# Patient Record
Sex: Male | Born: 1985 | Race: Black or African American | Marital: Single | Smoking: Never smoker
Health system: Southern US, Community
[De-identification: ages and names within clinical notes are randomized; demographics above are authoritative.]

## PROBLEM LIST (undated history)

## (undated) DIAGNOSIS — I10 Essential (primary) hypertension: Secondary | ICD-10-CM

## (undated) DIAGNOSIS — Z973 Presence of spectacles and contact lenses: Secondary | ICD-10-CM

## (undated) HISTORY — DX: Presence of spectacles and contact lenses: Z97.3

---

## 2012-03-25 ENCOUNTER — Ambulatory Visit (INDEPENDENT_AMBULATORY_CARE_PROVIDER_SITE_OTHER): Payer: BC Managed Care – PPO | Admitting: Medical

## 2012-03-25 ENCOUNTER — Encounter: Payer: Self-pay | Admitting: Medical

## 2012-03-25 VITALS — BP 120/80 | HR 78 | Temp 98.1°F | Resp 16 | Ht 72.0 in | Wt 248.0 lb

## 2012-03-25 DIAGNOSIS — Z113 Encounter for screening for infections with a predominantly sexual mode of transmission: Secondary | ICD-10-CM

## 2012-03-25 DIAGNOSIS — Z0000 Encounter for general adult medical examination without abnormal findings: Secondary | ICD-10-CM

## 2012-03-25 DIAGNOSIS — Z23 Encounter for immunization: Secondary | ICD-10-CM

## 2012-03-25 LAB — COMPREHENSIVE METABOLIC PANEL
ALT: 20 U/L (ref 0–53)
CO2: 26 mEq/L (ref 19–32)
Calcium: 10.3 mg/dL (ref 8.4–10.5)
Chloride: 105 mEq/L (ref 96–112)
Creat: 0.89 mg/dL (ref 0.50–1.35)
Total Protein: 7.3 g/dL (ref 6.0–8.3)

## 2012-03-25 LAB — CBC WITH DIFFERENTIAL/PLATELET
Basophils Absolute: 0 10*3/uL (ref 0.0–0.1)
HCT: 45 % (ref 39.0–52.0)
Lymphocytes Relative: 49 % — ABNORMAL HIGH (ref 12–46)
Neutro Abs: 2.6 10*3/uL (ref 1.7–7.7)
Platelets: 425 10*3/uL — ABNORMAL HIGH (ref 150–400)
RDW: 12.6 % (ref 11.5–15.5)
WBC: 6.9 10*3/uL (ref 4.0–10.5)

## 2012-03-25 LAB — POCT URINALYSIS DIPSTICK
Bilirubin, UA: NEGATIVE
Ketones, UA: NEGATIVE
Leukocytes, UA: NEGATIVE
Spec Grav, UA: 1.02
pH, UA: 5

## 2012-03-25 LAB — LIPID PANEL
HDL: 36 mg/dL — ABNORMAL LOW (ref >39)
Triglycerides: 219 mg/dL — ABNORMAL HIGH (ref <150)

## 2012-03-25 NOTE — Progress Notes (Signed)
Subjective:   HPI  Eddie [name] is a 26 y.o. male who presents for a complete physical.   Preventative care: Last ophthalmology visit: within a year with Dr. Louanna Raw Last dental visit: within a year with Dr. Chilton Si  Prior vaccinations: TD or Tdap: not sure Influenza: usually declines.  Reviewed their medical, surgical, family, social, medication, and allergy history and updated chart as appropriate.   History reviewed. No pertinent past medical history.  History reviewed. No pertinent past surgical history.  Family History  Problem Relation Age of Onset  . Diabetes Father   . Stroke Maternal Uncle   . Heart disease Paternal Grandfather   . Diabetes Paternal Grandfather   . Cancer Neg Hx   . Hypertension Other     paternal side    History   Social History  . Marital Status: Single    Spouse Name: N/A    Number of Children: N/A  . Years of Education: N/A   Occupational History  . Not on file.   Social History Main Topics  . Smoking status: Never Smoker   . Smokeless tobacco: Not on file  . Alcohol Use: No     social, rare occasion  . Drug Use: No  . Sexually Active: Not on file   Other Topics Concern  . Not on file   Social History Narrative   Works [employer], Geophysicist/field seismologist, physically active on the job, single, no children    No current outpatient prescriptions on file prior to visit.    No Known Allergies   Review of Systems Constitutional: -fever, -chills, -sweats, -unexpected weight change, -decreased appetite, -fatigue Allergy: -sneezing, -itching, -congestion Dermatology: -changing moles, --rash, -lumps ENT: -runny nose, -ear pain, -sore throat, -hoarseness, -sinus pain, -teeth pain, - ringing in ears, -hearing loss, -nosebleeds Cardiology: -chest pain, -palpitations, -swelling, -difficulty breathing when lying flat, -waking up short of breath Respiratory: -cough, -shortness of breath, -difficulty breathing with exercise or exertion, -wheezing, -coughing up  blood Gastroenterology: -abdominal pain, -nausea, -vomiting, -diarrhea, -constipation, -blood in stool, -changes in bowel movement, -difficulty swallowing or eating Hematology: -bleeding, -bruising  Musculoskeletal: -joint aches, -muscle aches, -joint swelling, -back pain, -neck pain, -cramping, -changes in gait Ophthalmology: denies vision changes, eye redness, itching, discharge Urology: -burning with urination, -difficulty urinating, -blood in urine, -urinary frequency, -urgency, -incontinence Neurology: -headache, -weakness, -tingling, -numbness, -memory loss, -falls, -dizziness Psychology: -depressed mood, -agitation, -sleep problems     Objective:   Physical Exam  Filed Vitals:   03/25/12 1358  BP: 120/80  Pulse: 78  Temp: 98.1 F (36.7 C)  Resp: 16    General appearance: alert, no distress, WD/WN, AA male, muscular build Skin: few scattered macules, no worrisoe lesions HEENT: normocephalic, conjunctiva/corneas normal, sclerae anicteric, PERRLA, EOMi, nares patent, no discharge or erythema, pharynx normal Oral cavity: MMM, tongue normal, teeth normal Neck: supple, no lymphadenopathy, no thyromegaly, no masses, normal ROM, no bruits Chest: non tender, normal shape and expansion Heart: RRR, normal S1, S2, no murmurs Lungs: CTA bilaterally, no wheezes, rhonchi, or rales Abdomen: +bs, soft, non tender, non distended, no masses, no hepatomegaly, no splenomegaly, no bruits Back: non tender, normal ROM, no scoliosis Musculoskeletal: upper extremities non tender, no obvious deformity, normal ROM throughout, lower extremities non tender, no obvious deformity, normal ROM throughout Extremities: no edema, no cyanosis, no clubbing Pulses: 2+ symmetric, upper and lower extremities, normal cap refill Neurological: alert, oriented x 3, CN2-12 intact, strength normal upper extremities and lower extremities, sensation normal throughout, DTRs 2+ throughout, no  cerebellar signs, gait  normal Psychiatric: normal affect, behavior normal, pleasant  GU: normal male external genitalia, nontender, no masses, no hernia, no lymphadenopathy Rectal: deferred   Assessment and Plan :      Encounter Diagnoses  Name Primary?  . Routine general medical examination at a health care facility Yes  . Screen for STD (sexually transmitted disease)   . Need for prophylactic vaccination and inoculation against influenza   . Need for Tdap vaccination     Physical exam - discussed healthy lifestyle, diet, exercise, preventative care, vaccinations, and addressed their concerns.    STD screening today.  Discussed safe sex, condom use.  Flu vaccine, VIS and counseling given.  Tdap vaccine, VIS and counseling given   Follow-up pending labs, 1 year.

## 2012-03-26 ENCOUNTER — Encounter: Payer: Self-pay | Admitting: Medical

## 2012-03-26 NOTE — Patient Instructions (Signed)
Preventative Care for Adults, Male       REGULAR HEALTH EXAMS:  A routine yearly physical is a good way to check in with your primary care provider about your health and preventive screening. It is also an opportunity to share updates about your health and any concerns you have, and receive a thorough all-over exam.   Most health insurance companies pay for at least some preventative services.  Check with your health plan for specific coverages.  WHAT PREVENTATIVE SERVICES DO MEN NEED?  Adult men should have their weight and blood pressure checked regularly.   Men age 35 and older should have their cholesterol levels checked regularly.  Beginning at age 50 and continuing to age 75, men should be screened for colorectal cancer.  Certain people should may need continued testing until age 85.  Other cancer screening may include exams for testicular and prostate cancer.  Updating vaccinations is part of preventative care.  Vaccinations help protect against diseases such as the flu.  Lab tests are generally done as part of preventative care to screen for anemia and blood disorders, to screen for problems with the kidneys and liver, to screen for bladder problems, to check blood sugar, and to check your cholesterol level.  Preventative services generally include counseling about diet, exercise, avoiding tobacco, drugs, excessive alcohol consumption, and sexually transmitted infections.    GENERAL RECOMMENDATIONS FOR GOOD HEALTH:  Healthy diet:  Eat a variety of foods, including fruit, vegetables, animal or vegetable protein, such as meat, fish, chicken, and eggs, or beans, lentils, tofu, and grains, such as rice.  Drink plenty of water daily.  Decrease saturated fat in the diet, avoid lots of red meat, processed foods, sweets, fast foods, and fried foods.  Exercise:  Aerobic exercise helps maintain good heart health. At least 30-40 minutes of moderate-intensity exercise is recommended.  For example, a brisk walk that increases your heart rate and breathing. This should be done on most days of the week.   Find a type of exercise or a variety of exercises that you enjoy so that it becomes a part of your daily life.  Examples are running, walking, swimming, water aerobics, and biking.  For motivation and support, explore group exercise such as aerobic class, spin class, Zumba, Yoga,or  martial arts, etc.    Set exercise goals for yourself, such as a certain weight goal, walk or run in a race such as a 5k walk/run.  Speak to your primary care provider about exercise goals.  Disease prevention:  If you smoke or chew tobacco, find out from your caregiver how to quit. It can literally save your life, no matter how long you have been a tobacco user. If you do not use tobacco, never begin.   Maintain a healthy diet and normal weight. Increased weight leads to problems with blood pressure and diabetes.   The Body Mass Index or BMI is a way of measuring how much of your body is fat. Having a BMI above 27 increases the risk of heart disease, diabetes, hypertension, stroke and other problems related to obesity. Your caregiver can help determine your BMI and based on it develop an exercise and dietary program to help you achieve or maintain this important measurement at a healthful level.  High blood pressure causes heart and blood vessel problems.  Persistent high blood pressure should be treated with medicine if weight loss and exercise do not work.   Fat and cholesterol leaves deposits in your arteries   that can block them. This causes heart disease and vessel disease elsewhere in your body.  If your cholesterol is found to be high, or if you have heart disease or certain other medical conditions, then you may need to have your cholesterol monitored frequently and be treated with medication.   Ask if you should have a stress test if your history suggests this. A stress test is a test done on  a treadmill that looks for heart disease. This test can find disease prior to there being a problem.  Avoid drinking alcohol in excess (more than two drinks per day).  Avoid use of street drugs. Do not share needles with anyone. Ask for professional help if you need assistance or instructions on stopping the use of alcohol, cigarettes, and/or drugs.  Brush your teeth twice a day with fluoride toothpaste, and floss once a day. Good oral hygiene prevents tooth decay and gum disease. The problems can be painful, unattractive, and can cause other health problems. Visit your dentist for a routine oral and dental check up and preventive care every 6-12 months.   Look at your skin regularly.  Use a mirror to look at your back. Notify your caregivers of changes in moles, especially if there are changes in shapes, colors, a size larger than a pencil eraser, an irregular border, or development of new moles.  Safety:  Use seatbelts 100% of the time, whether driving or as a passenger.  Use safety devices such as hearing protection if you work in environments with loud noise or significant background noise.  Use safety glasses when doing any work that could send debris in to the eyes.  Use a helmet if you ride a bike or motorcycle.  Use appropriate safety gear for contact sports.  Talk to your caregiver about gun safety.  Use sunscreen with a SPF (or skin protection factor) of 15 or greater.  Lighter skinned people are at a greater risk of skin cancer. Don't forget to also wear sunglasses in order to protect your eyes from too much damaging sunlight. Damaging sunlight can accelerate cataract formation.   Practice safe sex. Use condoms. Condoms are used for birth control and to help reduce the spread of sexually transmitted infections (or STIs).  Some of the STIs are gonorrhea (the clap), chlamydia, syphilis, trichomonas, herpes, HPV (human papilloma virus) and HIV (human immunodeficiency virus) which causes AIDS.  The herpes, HIV and HPV are viral illnesses that have no cure. These can result in disability, cancer and death.   Keep carbon monoxide and smoke detectors in your home functioning at all times. Change the batteries every 6 months or use a model that plugs into the wall.   Vaccinations:  Stay up to date with your tetanus shots and other required immunizations. You should have a booster for tetanus every 10 years. Be sure to get your flu shot every year, since 5%-20% of the [place] population comes down with the flu. The flu vaccine changes each year, so being vaccinated once is not enough. Get your shot in the fall, before the flu season peaks.   Other vaccines to consider:  Pneumococcal vaccine to protect against certain types of pneumonia.  This is normally recommended for adults age 65 or older.  However, adults younger than 26 years old with certain underlying conditions such as diabetes, heart or lung disease should also receive the vaccine.  Shingles vaccine to protect against Varicella Zoster if you are older than age 60, or younger   than 26 years old with certain underlying illness.  Hepatitis A vaccine to protect against a form of infection of the liver by a virus acquired from food.  Hepatitis B vaccine to protect against a form of infection of the liver by a virus acquired from blood or body fluids, particularly if you work in health care.  If you plan to travel internationally, check with your local health department for specific vaccination recommendations.  Cancer Screening:  Most routine colon cancer screening begins at the age of 50. On a yearly basis, doctors may provide special easy to use take-home tests to check for hidden blood in the stool. Sigmoidoscopy or colonoscopy can detect the earliest forms of colon cancer and is life saving. These tests use a small camera at the end of a tube to directly examine the colon. Speak to your caregiver about this at age 50, when routine  screening begins (and is repeated every 5 years unless early forms of pre-cancerous polyps or small growths are found).   At the age of 50 men usually start screening for prostate cancer every year. Screening may begin at a younger age for those with higher risk. Those at higher risk include African-Americans or having a family history of prostate cancer. There are two types of tests for prostate cancer:   Prostate-specific antigen (PSA) testing. Recent studies raise questions about prostate cancer using PSA and you should discuss this with your caregiver.   Digital rectal exam (in which your doctor's lubricated and gloved finger feels for enlargement of the prostate through the anus).   Screening for testicular cancer.  Do a monthly exam of your testicles. Gently roll each testicle between your thumb and fingers, feeling for any abnormal lumps. The best time to do this is after a hot shower or bath when the tissues are looser. Notify your caregivers of any lumps, tenderness or changes in size or shape immediately.     

## 2012-03-28 ENCOUNTER — Ambulatory Visit (INDEPENDENT_AMBULATORY_CARE_PROVIDER_SITE_OTHER): Payer: BC Managed Care – PPO | Admitting: Medical

## 2012-03-28 ENCOUNTER — Encounter: Payer: Self-pay | Admitting: Medical

## 2012-03-28 VITALS — BP 120/80 | HR 92 | Temp 98.2°F | Resp 16 | Wt 250.0 lb

## 2012-03-28 DIAGNOSIS — L509 Urticaria, unspecified: Secondary | ICD-10-CM

## 2012-03-28 DIAGNOSIS — Z113 Encounter for screening for infections with a predominantly sexual mode of transmission: Secondary | ICD-10-CM

## 2012-03-28 DIAGNOSIS — E781 Pure hyperglyceridemia: Secondary | ICD-10-CM

## 2012-03-28 LAB — GC/CHLAMYDIA PROBE AMP, URINE

## 2012-03-28 MED ORDER — PREDNISONE 10 MG PO TABS: ORAL_TABLET | ORAL | Status: DC

## 2012-03-28 NOTE — Patient Instructions (Signed)

## 2012-03-28 NOTE — Progress Notes (Signed)
Subjective:   HPI  Eddie [name] is a 26 y.o. male who presents  Itchy rash, started yesterday on arms, but then spread to abdomen, legs, itching all over.  The only new changes was that flu vaccine and Tdap vaccine given here 3 days ago.   He has never had flu vaccine prior, but has had tdap in the past without c/o.   Other than recent [employer] drinks, no other recent exposures, no new foods, new chemicals, no new hygiene product.  No other triggers.  He notes similar but milder reactions in the past intermittent, cause unknown.  Has used a few benadryl oral. No other aggravating or relieving factors.  Otherwise feels fine.    No other c/o.  The following portions of the patient's history were reviewed and updated as appropriate: allergies, current medications, past family history, past medical history, past social history, past surgical history and problem list.  Past Medical History  Diagnosis Date  . Wears glasses     No Known Allergies   Review of Systems ROS reviewed and was negative other than noted in HPI or above.    Objective:   Physical Exam  General appearance: alert, no distress, WD/WN Skin: scattered small whealed lesions on arms and torso mostly, but a few on legs, neck Oral cavity: MMM, no lesions Neck: supple, no lymphadenopathy, no thyromegaly, no masses Heart: RRR, normal S1, S2, no murmurs Lungs: CTA bilaterally, no wheezes, rhonchi, or rales Abdomen: +bs, soft, non tender, non distended, no masses, no hepatomegaly, no splenomegaly  Assessment and Plan :     Encounter Diagnoses  Name Primary?  Marland Kitchen Urticaria Yes  . Screen for STD (sexually transmitted disease)   . High triglycerides    Urticaria - reviewed his recent labs from his physical.  C/t Benadryl every 4-6 hours, if not improving or worse, then begin Prednisone taper.  Script written.     Screen for STD - repeat Urine for GC/Chlamydia screen.  Somehow urine got lost at the lab, thus repeat urine  sample today.    High triglycerides - advised diet changes to improve triglycerides and low HDL.  He is very active.  Repeat 1 year.

## 2012-03-29 LAB — GC/CHLAMYDIA PROBE AMP, URINE: Chlamydia, Swab/Urine, PCR: NEGATIVE

## 2012-09-22 ENCOUNTER — Encounter: Payer: Self-pay | Admitting: Family Medicine

## 2012-09-22 ENCOUNTER — Ambulatory Visit (INDEPENDENT_AMBULATORY_CARE_PROVIDER_SITE_OTHER): Payer: BC Managed Care – PPO | Admitting: Family Medicine

## 2012-09-22 VITALS — BP 120/80 | HR 70 | Wt 250.0 lb

## 2012-09-22 DIAGNOSIS — S93402A Sprain of unspecified ligament of left ankle, initial encounter: Secondary | ICD-10-CM

## 2012-09-22 DIAGNOSIS — S93409A Sprain of unspecified ligament of unspecified ankle, initial encounter: Secondary | ICD-10-CM

## 2012-09-22 NOTE — Progress Notes (Signed)
  Subjective:    Patient ID: Eddie [name], male    DOB: 03-05-1986, 27 y.o.   MRN: [id number]  HPI He injured his left ankle on April 27 while playing flag football. He states he landed in a hole and inverted it. He  Was able to walk on it initially after the injury but later was unable and apparently missed 3 days of work because of this.   Review of Systems     Objective:   Physical Exam Exam of the left ankle show swelling and discoloration especially over the lateral malleolus. The ATF area was less swollen. Slight tenderness palpation over the lateral malleolus. No joint laxity was noted. X-ray shows no fracture.       Assessment & Plan:  Ankle sprain, left, initial encounter recommend Rice, ice, compression, elevation. Discussed slow return to activity based on symptoms. He'll return here if further difficulty.

## 2012-09-22 NOTE — Patient Instructions (Signed)
Ankle Sprain  An ankle sprain is an injury to the strong, fibrous tissues (ligaments) that hold the bones of your ankle joint together.   CAUSES  An ankle sprain is usually caused by a fall or by twisting your ankle. Ankle sprains most commonly occur when you step on the outer edge of your foot, and your ankle turns inward. People who participate in sports are more prone to these types of injuries.   SYMPTOMS    Pain in your ankle. The pain may be present at rest or only when you are trying to stand or walk.   Swelling.   Bruising. Bruising may develop immediately or within 1 to 2 days after your injury.   Difficulty standing or walking, particularly when turning corners or changing directions.  DIAGNOSIS   Your caregiver will ask you details about your injury and perform a physical exam of your ankle to determine if you have an ankle sprain. During the physical exam, your caregiver will press on and apply pressure to specific areas of your foot and ankle. Your caregiver will try to move your ankle in certain ways. An X-ray exam may be done to be sure a bone was not broken or a ligament did not separate from one of the bones in your ankle (avulsion fracture).   TREATMENT   Certain types of braces can help stabilize your ankle. Your caregiver can make a recommendation for this. Your caregiver may recommend the use of medicine for pain. If your sprain is severe, your caregiver may refer you to a surgeon who helps to restore function to parts of your skeletal system (orthopedist) or a physical therapist.  HOME CARE INSTRUCTIONS    Apply ice to your injury for 1 to 2 days or as directed by your caregiver. Applying ice helps to reduce inflammation and pain.   Put ice in a plastic bag.   Place a towel between your skin and the bag.   Leave the ice on for 15 to 20 minutes at a time, every 2 hours while you are awake.   Only take over-the-counter or prescription medicines for pain, discomfort, or fever as directed  by your caregiver.   Keep your injured leg elevated, when possible, to lessen swelling.   If your caregiver recommends crutches, use them as instructed. Gradually put weight on the affected ankle. Continue to use crutches or a cane until you can walk without feeling pain in your ankle.   If you have a plaster splint, wear the splint as directed by your caregiver. Do not rest it on anything harder than a pillow for the first 24 hours. Do not put weight on it. Do not get it wet. You may take it off to take a shower or bath.   You may have been given an elastic bandage to wear around your ankle to provide support. If the elastic bandage is too tight (you have numbness or tingling in your foot or your foot becomes cold and blue), adjust the bandage to make it comfortable.   If you have an air splint, you may blow more air into it or let air out to make it more comfortable. You may take your splint off at night and before taking a shower or bath.   Wiggle your toes in the splint several times per day to decrease swelling.  SEEK MEDICAL CARE IF:    You have an increase in bruising, swelling, or pain.   Your toes feel extremely cold   You have severe pain. MAKE SURE YOU:   Understand these instructions.  Will watch your condition.  Will get help right away if you are not doing well or get worse. Document Released: 05/11/2005 Document Revised: 08/03/2011 Document Reviewed: 05/23/2011 [facility] Patient Information 2013 [place], [place]. Rest. Ice. Compression. Elevation.  Slowly increase your physical activities based on discomfort.first you have to walk without pain in jaw then has to be done.

## 2021-02-18 ENCOUNTER — Encounter (HOSPITAL_COMMUNITY): Payer: Self-pay

## 2021-02-18 ENCOUNTER — Emergency Department (HOSPITAL_COMMUNITY): Payer: BC Managed Care – PPO

## 2021-02-18 ENCOUNTER — Emergency Department (HOSPITAL_COMMUNITY)
Admission: EM | Admit: 2021-02-18 | Discharge: 2021-02-18 | Disposition: A | Discharge status: Home / Self Care | Payer: BC Managed Care – PPO | Attending: Emergency Medicine | Admitting: Emergency Medicine

## 2021-02-18 DIAGNOSIS — I10 Essential (primary) hypertension: Secondary | ICD-10-CM

## 2021-02-18 DIAGNOSIS — M5412 Radiculopathy, cervical region: Secondary | ICD-10-CM

## 2021-02-18 DIAGNOSIS — E876 Hypokalemia: Secondary | ICD-10-CM

## 2021-02-18 DIAGNOSIS — R200 Anesthesia of skin: Secondary | ICD-10-CM | POA: Diagnosis not present

## 2021-02-18 DIAGNOSIS — M542 Cervicalgia: Secondary | ICD-10-CM | POA: Diagnosis present

## 2021-02-18 LAB — CBC
HCT: 47.3 % (ref 39.0–52.0)
Hemoglobin: 16.3 g/dL (ref 13.0–17.0)
MCH: 28.2 pg (ref 26.0–34.0)
MCHC: 34.5 g/dL (ref 30.0–36.0)
MCV: 81.7 fL (ref 80.0–100.0)
Platelets: 497 10*3/uL — ABNORMAL HIGH (ref 150–400)
RBC: 5.79 MIL/uL (ref 4.22–5.81)
RDW: 12.1 % (ref 11.5–15.5)
WBC: 7.3 10*3/uL (ref 4.0–10.5)
nRBC: 0 % (ref 0.0–0.2)

## 2021-02-18 LAB — BASIC METABOLIC PANEL
Anion gap: 14 (ref 5–15)
BUN: 12 mg/dL (ref 6–20)
CO2: 27 mmol/L (ref 22–32)
Calcium: 9.2 mg/dL (ref 8.9–10.3)
Chloride: 98 mmol/L (ref 98–111)
Creatinine, Ser: 0.81 mg/dL (ref 0.61–1.24)
GFR, Estimated: >60 mL/min (ref >60)
Glucose, Bld: 116 mg/dL — ABNORMAL HIGH (ref 70–99)
Potassium: 3 mmol/L — ABNORMAL LOW (ref 3.5–5.1)
Sodium: 139 mmol/L (ref 135–145)

## 2021-02-18 LAB — TROPONIN I (HIGH SENSITIVITY)
Troponin I (High Sensitivity): 7 ng/L (ref <18)
Troponin I (High Sensitivity): 6 ng/L (ref <18)

## 2021-02-18 LAB — MAGNESIUM: Magnesium: 2 mg/dL (ref 1.7–2.4)

## 2021-02-18 MED ORDER — POTASSIUM CHLORIDE CRYS ER 20 MEQ PO TBCR
40.0000 meq | EXTENDED_RELEASE_TABLET | Freq: Once | ORAL | Status: AC
  Administered 2021-02-18: 40 meq via ORAL
  Filled 2021-02-18: qty 2

## 2021-02-18 MED ORDER — KETOROLAC TROMETHAMINE 15 MG/ML IJ SOLN
15.0000 mg | Freq: Once | INTRAMUSCULAR | Status: AC
  Administered 2021-02-18: 15 mg via INTRAVENOUS
  Filled 2021-02-18: qty 1

## 2021-02-18 NOTE — ED Provider Notes (Signed)
Emergency Medicine Provider Triage Evaluation Note  Eddie [name] , a 35 y.o. male  was evaluated in triage.  Pt complains of right neck/trapezius pain. Worse with movement. Does a lot of lifting at his job and is right handed. Seen at [facility] and had elevated BP, sent here for htn urgency. Denies cp, sob.  Review of Systems  Positive: Right neck/trapezius pain Negative: Chest pain, sob  Physical Exam  BP (!) 217/140 (BP Location: Left Arm)   Pulse 85   Temp 99.3 F (37.4 C) (Oral)   Resp 18   Ht 6' (1.829 m)   Wt 126.6 kg   BMI 37.84 kg/m  Gen:   Awake, no distress   Resp:  Normal effort  MSK:   Moves extremities without difficulty  Other:  Ttp to the right cervical paraspinous muscles and medial right traps which reproduces sxs, heart rrr  Medical Decision Making  Medically screening exam initiated at 1:58 PM.  Appropriate orders placed.  Eddie [name] was informed that the remainder of the evaluation will be completed by another provider, this initial triage assessment does not replace that evaluation, and the importance of remaining in the ED until their evaluation is complete.     [name] 02/18/21 1359    Glendora Score, MD 02/18/21 1755

## 2021-02-18 NOTE — ED Triage Notes (Addendum)
Pt reports neck pain radiating down right arm since Thursday. Patient went to [facility] today and has a BP of 198/134 with abnormal ECG per patient and paper work.  Pt given 324mg  Aspirin at [facility] per paper work  EDP made aware and will repeat EKG.    A/Ox4 Ambulatory in triage

## 2021-02-18 NOTE — ED Notes (Signed)
Called lab to request add on Mg per order

## 2021-02-18 NOTE — Discharge Instructions (Addendum)
Recommend to establish care with a primary care provider to discuss outpatient blood pressure control after BP recheck.Recommend NSAIDs for your neck pain

## 2021-02-18 NOTE — ED Notes (Signed)
Notified provider of pt's continued BP elevation

## 2021-02-18 NOTE — ED Provider Notes (Signed)
[facility]-EMERGENCY DEPT Provider Note   CSN: [id number] Arrival date & time: 02/18/21  1314     History Chief Complaint  Patient presents with   Hypertension   Neck Pain    Radiating to arm pain     Eddie [name] is a 35 y.o. male.  The history is provided by the patient.  Hypertension This is a new problem. The current episode started more than 2 days ago. The problem occurs constantly. The problem has not changed since onset.Pertinent negatives include no chest pain, no headaches and no shortness of breath. The symptoms are relieved by rest. He has tried ASA for the symptoms. The treatment provided no relief.  Neck Pain Pain location:  Generalized neck Quality:  Shooting Pain radiates to:  R arm and R hand Pain severity:  Mild Duration:  6 days Timing:  Intermittent Progression:  Waxing and waning Chronicity:  New Context: lifting a heavy object   Context: not fall   Relieved by:  Nothing Worsened by:  Position (Extension of the neck) Associated symptoms: numbness   Associated symptoms: no chest pain, no fever, no headaches, no paresis, no tingling, no visual change and no weakness    Patient is a 35 year old male who presents from urgent care with concern for uncontrolled hypertension.  The patient states that he presented to urgent care earlier today due to radicular neck/trapezius pain.  Pain has been ongoing since last Thursday.  He describes a sharp and shooting pain that radiates from his neck down his right arm with some associated numbness along his first, second and third digits that is intermittent.  He also endorses some trapezius pain.  He states he does a lots of heavy lifting at his job.  He is right-handed.  He currently denies any pain.  He denies any recent trauma to his neck.  He denies any chest pain, shortness of breath, nausea, diaphoresis, back pain.  He additionally denies any headaches or blurry vision.  Past Medical History:   Diagnosis Date   Wears glasses     There are no problems to display for this patient.   History reviewed. No pertinent surgical history.     Family History  Problem Relation Age of Onset   Diabetes Father    Stroke Maternal Uncle    Heart disease Paternal Grandfather    Diabetes Paternal Grandfather    Cancer Neg Hx    Hypertension Other        paternal side    Social History   Tobacco Use   Smoking status: Never  Substance Use Topics   Alcohol use: No    Comment: social, rare occasion   Drug use: No    Home Medications Prior to Admission medications   Not on File    Allergies    Patient has no known allergies.  Review of Systems   Review of Systems  Constitutional:  Negative for fever.  Respiratory:  Negative for shortness of breath.   Cardiovascular:  Negative for chest pain.  Musculoskeletal:  Positive for neck pain.  Neurological:  Positive for numbness. Negative for tingling, weakness and headaches.  All other systems reviewed and are negative.  Physical Exam Updated Vital Signs BP (!) 169/110 (BP Location: Right Arm)   Pulse 83   Temp 99.3 F (37.4 C) (Oral)   Resp 18   Ht 6' (1.829 m)   Wt 126.6 kg   SpO2 95%   BMI 37.84 kg/m   Physical  Exam Vitals and nursing note reviewed.  Constitutional:      Appearance: He is well-developed.  HENT:     Head: Normocephalic and atraumatic.  Eyes:     Conjunctiva/sclera: Conjunctivae normal.  Neck:     Comments: No midline TTP. Negative spurling's test. Cardiovascular:     Rate and Rhythm: Normal rate and regular rhythm.     Heart sounds: No murmur heard. Pulmonary:     Effort: Pulmonary effort is normal. No respiratory distress.     Breath sounds: Normal breath sounds.  Abdominal:     Palpations: Abdomen is soft.     Tenderness: There is no abdominal tenderness.  Musculoskeletal:        General: Normal range of motion.     Cervical back: Normal range of motion and neck supple. No  tenderness.  Skin:    General: Skin is warm and dry.  Neurological:     General: No focal deficit present.     Mental Status: He is alert. Mental status is at baseline.     Cranial Nerves: No cranial nerve deficit.     Sensory: No sensory deficit.     Motor: No weakness.     Coordination: Coordination normal.    ED Results / Procedures / Treatments   Labs (all labs ordered are listed, but only abnormal results are displayed) Labs Reviewed  BASIC METABOLIC PANEL - Abnormal; Notable for the following components:      Result Value   Potassium 3.0 (*)    Glucose, Bld 116 (*)    All other components within normal limits  CBC - Abnormal; Notable for the following components:   Platelets 497 (*)    All other components within normal limits  MAGNESIUM  TROPONIN I (HIGH SENSITIVITY)  TROPONIN I (HIGH SENSITIVITY)    EKG EKG Interpretation  Date/Time:  Tuesday February 18 2021 13:52:54 EDT Ventricular Rate:  105 PR Interval:  179 QRS Duration: 95 QT Interval:  361 QTC Calculation: 478 R Axis:   71 Text Interpretation: Sinus tachycardia Probable left atrial enlargement Left ventricular hypertrophy Borderline T abnormalities, inferior leads Borderline prolonged QT interval No STEMI Confirmed by Ernie Avena ([id number]) on 02/18/2021 3:39:25 PM  Radiology DG Chest 2 View  Result Date: 02/18/2021 CLINICAL DATA:  35 year old male with history of neck pain radiating into the right arm. EXAM: CHEST - 2 VIEW COMPARISON:  No priors. FINDINGS: Lung volumes are normal. No consolidative airspace disease. No pleural effusions. No pneumothorax. No pulmonary nodule or mass noted. Pulmonary vasculature and the cardiomediastinal silhouette are within normal limits. IMPRESSION: No radiographic evidence of acute cardiopulmonary disease. Electronically Signed   By: Trudie Reed M.D.   On: 02/18/2021 14:58    Procedures Procedures   Medications Ordered in ED Medications  potassium chloride SA  (KLOR-CON) CR tablet 40 mEq (40 mEq Oral Given 02/18/21 1559)  ketorolac (TORADOL) 15 MG/ML injection 15 mg (15 mg Intravenous Given 02/18/21 1717)    ED Course  I have reviewed the triage vital signs and the nursing notes.  Pertinent labs & imaging results that were available during my care of the patient were reviewed by me and considered in my medical decision making (see chart for details).    MDM Rules/Calculators/A&P                           35 year old male with no significant past medical history who presents with radicular neck pain symptoms  and was sent from urgent care due to hypertension.  The patient currently endorses asymptomatic hypertension.  On arrival, the patient was afebrile, hypertensive BP 218/124, subsequently improved to the 180s systolic on recheck, not tachycardic or tachypneic, saturating well on room air.  The patient currently denies any chest pain, blurry vision, headaches, shortness of breath, abdominal pain, diaphoresis, palpitations.  He endorses radicular neck pain symptoms that have been present since Thursday.  He does endorse lifting heavy objects as part of his job.  Some subjective numbness along the median nerve distribution but no numbness noted on exam with intact 5 out of 5 grip strength and upper extremity strength bilaterally.  Negative spurling's test.  Initial EKG revealed some flattening of T waves in the inferior leads, likely benign early repolarization in the anterior leads.  Repeat EKG revealed possible ST depression in leads V5 and V6, considered hypertensive changes as the patient currently remains without symptoms of ACS.  No ripping or tearing pain.  Pulses intact 2+ radial bilaterally.  Low concern for aortic dissection.  No chest pain or shortness of breath or hypoxia or tachycardia to suggest PE.  The patient is PERC negative.  Work-up initiated to include CBC, BMP, troponins x2.  Potassium hypokalemic to 3.0, magnesium normal.  Potassium  supplementation orally 40 mEq was administered.  A chest x-ray was without evidence of pneumothorax, widened mediastinum or pneumonia.  Initial troponin was normal at 7.  The patient currently does not follow with a primary care provider and is not on any outpatient medications.  He remained asymptomatic with a steady improvement in his blood pressure after lying in a dark room for 15 minutes. Overall workup is reassuring at this time. Encouraged the patient to follow-up outpatient and establish care with a PCP to discuss BP recheck and further long term antihypertensive measures as indicated.    Final Clinical Impression(s) / ED Diagnoses Final diagnoses:  Cervical radiculopathy  Asymptomatic hypertension  Hypokalemia    Rx / DC Orders ED Discharge Orders     None        Ernie Avena, MD 02/19/21 1127

## 2021-02-20 ENCOUNTER — Emergency Department (HOSPITAL_COMMUNITY)
Admission: EM | Admit: 2021-02-20 | Discharge: 2021-02-20 | Disposition: A | Discharge status: Home / Self Care | Payer: BC Managed Care – PPO | Attending: Emergency Medicine | Admitting: Emergency Medicine

## 2021-02-20 ENCOUNTER — Other Ambulatory Visit: Payer: Self-pay

## 2021-02-20 ENCOUNTER — Encounter (HOSPITAL_COMMUNITY): Payer: Self-pay

## 2021-02-20 DIAGNOSIS — I10 Essential (primary) hypertension: Secondary | ICD-10-CM | POA: Insufficient documentation

## 2021-02-20 DIAGNOSIS — Z09 Encounter for follow-up examination after completed treatment for conditions other than malignant neoplasm: Secondary | ICD-10-CM

## 2021-02-20 DIAGNOSIS — R030 Elevated blood-pressure reading, without diagnosis of hypertension: Secondary | ICD-10-CM | POA: Diagnosis present

## 2021-02-20 MED ORDER — AMLODIPINE BESYLATE 5 MG PO TABS: 5.0000 mg | ORAL_TABLET | Freq: Every day | ORAL | 0 refills | Status: DC

## 2021-02-20 MED ORDER — AMLODIPINE BESYLATE 5 MG PO TABS
5.0000 mg | ORAL_TABLET | Freq: Once | ORAL | Status: AC
  Administered 2021-02-20: 5 mg via ORAL
  Filled 2021-02-20: qty 1

## 2021-02-20 NOTE — ED Notes (Signed)
An After Visit Summary was printed and given to the patient. Discharge instructions given and no further questions at this time.  

## 2021-02-20 NOTE — ED Triage Notes (Addendum)
Patient states he is here for a follow up to his visit for neck pain and hypertension. BP in triage- 177/124. Patient  states he has a new PCP, but appointment is a month away. Patient states he needs a work note for [employer].

## 2021-02-20 NOTE — ED Provider Notes (Signed)
[name]-EMER[facility]PT Provider Note   CSN: [id number] Arrival date & time: 02/20/21  1439     History Chief Complaint  Patient presents with   Follow-up    Eddie [name] is a 35 y.o. male who presents to the ED today for follow-up.  Patient was seen in the ED 2 days ago for cervical radiculopathy.  It appears he initially went to urgent care for same however was noted to have elevated blood pressure and was advised to come to the ED for further eval.  No history of same however does not see a PCP yearly for physicals.  It appears he was initially hypertensive at 218/124 with recheck in the 180s systolic.  Patient had work-up including lab work, chest x-ray, EKG however denied any specific symptoms related to high blood pressure.  His work-up was overall reassuring and he was discharged home with advised to follow-up with PCP.  He states he has an appointment on 10/14.  He states he is mostly here for a work note today.  He was given a work note for 2 days however he still is complaining of pain in his trapezius and neck area and feels like he cannot do heavy lifting at work.  Patient again denies any headache, vision changes, chest pain, shortness of breath, heart palpitations, nausea, vomiting, urinary issues.   The history is provided by the patient and medical records.      Past Medical History:  Diagnosis Date   Wears glasses     There are no problems to display for this patient.   History reviewed. No pertinent surgical history.     Family History  Problem Relation Age of Onset   Diabetes Father    Stroke Maternal Uncle    Heart disease Paternal Grandfather    Diabetes Paternal Grandfather    Cancer Neg Hx    Hypertension Other        paternal side    Social History   Tobacco Use   Smoking status: Never   Smokeless tobacco: Never  Vaping Use   Vaping Use: Never used  Substance Use Topics   Alcohol use: No    Comment: social, rare  occasion   Drug use: No    Home Medications Prior to Admission medications   Medication Sig Start Date End Date Taking? Authorizing Provider  amLODipine (NORVASC) 5 MG tablet Take 1 tablet (5 mg total) by mouth daily. 02/20/21  Yes Hyman Hopes, Aujanae Mccullum, PA-C    Allergies    Patient has no known allergies.  Review of Systems   Review of Systems  Constitutional:  Negative for chills and fever.  Eyes:  Negative for visual disturbance.  Respiratory:  Negative for shortness of breath.   Cardiovascular:  Negative for chest pain.  Musculoskeletal:  Positive for arthralgias.  Neurological:  Negative for weakness, numbness and headaches.  All other systems reviewed and are negative.  Physical Exam Updated Vital Signs BP (!) 177/124 (BP Location: Left Arm)   Pulse 81   Temp 97.9 F (36.6 C) (Oral)   Resp 18   Ht 6' (1.829 m)   Wt 126.6 kg   SpO2 100%   BMI 37.84 kg/m   Physical Exam Vitals and nursing note reviewed.  Constitutional:      Appearance: He is not ill-appearing.  HENT:     Head: Normocephalic and atraumatic.  Eyes:     Conjunctiva/sclera: Conjunctivae normal.  Neck:     Comments: + TTP to  right trapezius Cardiovascular:     Rate and Rhythm: Normal rate and regular rhythm.  Pulmonary:     Effort: Pulmonary effort is normal.     Breath sounds: Normal breath sounds.  Skin:    General: Skin is warm and dry.     Coloration: Skin is not jaundiced.  Neurological:     Mental Status: He is alert.    ED Results / Procedures / Treatments   Labs (all labs ordered are listed, but only abnormal results are displayed) Labs Reviewed - No data to display  EKG None  Radiology No results found.  Procedures Procedures   Medications Ordered in ED Medications  amLODipine (NORVASC) tablet 5 mg (has no administration in time range)    ED Course  I have reviewed the triage vital signs and the nursing notes.  Pertinent labs & imaging results that were available  during my care of the patient were reviewed by me and considered in my medical decision making (see chart for details).    MDM Rules/Calculators/A&P                           35 year old male who presents to the ED today for follow-up.  Seen 2 days ago for right neck/trapezius pain however found to be hypertensive without history of same.  He is here for a work note today.  On arrival to the ED patient is again hypertensive at 194/126 with repeat 177/124.  He is not prescribed blood pressure medication during last ED visit and has an appointment on 10/14 to establish care with PCP.  He denies any symptoms related to high blood pressure at this time.  He continues to complain of some pain to his neck and states he is here for a work note because he does not feel like he can continue his work with same.  Denies any worsening symptoms.  Given patient is asymptomatic at this time and had lab work done including EKG, chest x-ray, CBC, BMP 2 days ago I do not feel he needs repeat testing at this time.  I will place him on a blood pressure medication today and patient is instructed to follow-up with PCP as scheduled to discuss blood pressure.  I will give him a limited activity work note at this time however I did have a lengthy discussion with patient regarding the fact that we cannot keep him out of work indefinitely and he will need to follow-up with PCP regarding this as well.  I have discussed this with attending physician Dr. Freida Busman who thinks this is reasonable as well.  Patient to be discharged home at this time with new Rx for amlodipine.  This note was prepared using Dragon voice recognition software and may include unintentional dictation errors due to the inherent limitations of voice recognition software.  Final Clinical Impression(s) / ED Diagnoses Final diagnoses:  Follow up  Primary hypertension    Rx / DC Orders ED Discharge Orders          Ordered    amLODipine (NORVASC) 5 MG tablet   Daily        02/20/21 1622             Discharge Instructions      Please pick up medication for high blood pressure and take as prescribed.  It is recommended that you check your blood pressure daily the blood pressure cuff and keep a log of this to take  to your primary care appointment on 10/14.  Please also follow-up with them regarding the pain in your neck.  You can take ibuprofen and Tylenol as needed for same.         Tanda Rockers, PA-C 02/20/21 1624    Lorre Nick, MD 02/23/21 [phone]

## 2021-02-20 NOTE — Discharge Instructions (Addendum)
Please pick up medication for high blood pressure and take as prescribed.  It is recommended that you check your blood pressure daily the blood pressure cuff and keep a log of this to take to your primary care appointment on 10/14.  Please also follow-up with them regarding the pain in your neck.  You can take ibuprofen and Tylenol as needed for same.

## 2021-03-03 ENCOUNTER — Encounter (HOSPITAL_COMMUNITY): Payer: Self-pay

## 2021-03-03 ENCOUNTER — Emergency Department (HOSPITAL_COMMUNITY)
Admission: EM | Admit: 2021-03-03 | Discharge: 2021-03-03 | Disposition: A | Discharge status: Home / Self Care | Payer: BC Managed Care – PPO | Attending: Emergency Medicine | Admitting: Emergency Medicine

## 2021-03-03 ENCOUNTER — Other Ambulatory Visit: Payer: Self-pay

## 2021-03-03 DIAGNOSIS — I10 Essential (primary) hypertension: Secondary | ICD-10-CM | POA: Insufficient documentation

## 2021-03-03 DIAGNOSIS — Z79899 Other long term (current) drug therapy: Secondary | ICD-10-CM | POA: Diagnosis not present

## 2021-03-03 DIAGNOSIS — M542 Cervicalgia: Secondary | ICD-10-CM | POA: Diagnosis present

## 2021-03-03 DIAGNOSIS — M5412 Radiculopathy, cervical region: Secondary | ICD-10-CM | POA: Insufficient documentation

## 2021-03-03 HISTORY — DX: Essential (primary) hypertension: I10

## 2021-03-03 MED ORDER — AMLODIPINE BESYLATE 10 MG PO TABS: 5.0000 mg | ORAL_TABLET | Freq: Every day | ORAL | 0 refills | Status: DC

## 2021-03-03 NOTE — ED Provider Notes (Signed)
[facility]-EMERGENCY DEPT Provider Note   CSN: [id number] Arrival date & time: 03/03/21  1723     History Chief Complaint  Patient presents with   Neck Pain    Eddie [name] is a 35 y.o. male.  Patient presents the emergency department with complaint of requesting work limitations now.  Patient states that he needs a note for work stating that his cervical radiculopathy limits him from his normal duties.  Patient reports that his cervical radiculopathy has been improving since being seen in the emergency department.  States that he still has intermittent pain and numbness.  No pain or numbness at this time.  Patient reports that he has been taking his blood pressure medication.  Denies any headache, chest pain, numbness, weakness, facial asymmetry.     Neck Pain Associated symptoms: numbness   Associated symptoms: no chest pain, no fever, no headaches and no weakness       Past Medical History:  Diagnosis Date   Hypertension    Wears glasses     There are no problems to display for this patient.   History reviewed. No pertinent surgical history.     Family History  Problem Relation Age of Onset   Diabetes Father    Stroke Maternal Uncle    Heart disease Paternal Grandfather    Diabetes Paternal Grandfather    Cancer Neg Hx    Hypertension Other        paternal side    Social History   Tobacco Use   Smoking status: Never   Smokeless tobacco: Never  Vaping Use   Vaping Use: Never used  Substance Use Topics   Alcohol use: No    Comment: social, rare occasion   Drug use: No    Home Medications Prior to Admission medications   Medication Sig Start Date End Date Taking? Authorizing Provider  amLODipine (NORVASC) 5 MG tablet Take 1 tablet (5 mg total) by mouth daily. 02/20/21   Tanda Rockers, PA-C    Allergies    Patient has no known allergies.  Review of Systems   Review of Systems  Constitutional:  Negative for chills and  fever.  Eyes:  Negative for visual disturbance.  Respiratory:  Negative for shortness of breath.   Cardiovascular:  Negative for chest pain.  Gastrointestinal:  Negative for abdominal pain, nausea and vomiting.  Genitourinary:  Negative for difficulty urinating.  Musculoskeletal:  Positive for neck pain. Negative for back pain.  Skin:  Negative for color change and rash.  Neurological:  Positive for numbness. Negative for dizziness, tremors, seizures, syncope, facial asymmetry, speech difficulty, weakness, light-headedness and headaches.  Psychiatric/Behavioral:  Negative for confusion.    Physical Exam Updated Vital Signs BP (!) 210/112 (BP Location: Left Arm)   Pulse 92   Resp 18   Ht 6' (1.829 m)   Wt 122.5 kg   SpO2 100%   BMI 36.62 kg/m   Physical Exam Vitals and nursing note reviewed.  Constitutional:      General: He is not in acute distress.    Appearance: He is not ill-appearing, toxic-appearing or diaphoretic.  HENT:     Head: Normocephalic.  Eyes:     General: No scleral icterus.       Right eye: No discharge.        Left eye: No discharge.  Cardiovascular:     Rate and Rhythm: Normal rate.  Pulmonary:     Effort: Pulmonary effort is normal.  Skin:  General: Skin is warm and dry.  Neurological:     General: No focal deficit present.     Mental Status: He is alert and oriented to person, place, and time.     GCS: GCS eye subscore is 4. GCS verbal subscore is 5. GCS motor subscore is 6.     Comments: No dysarthria or facial asymmetry.  Patient moves all limbs equally without difficulty.  Patient able to stand and ambulate without difficulty.  Psychiatric:        Behavior: Behavior is cooperative.    ED Results / Procedures / Treatments   Labs (all labs ordered are listed, but only abnormal results are displayed) Labs Reviewed - No data to display  EKG None  Radiology No results found.  Procedures Procedures   Medications Ordered in  ED Medications - No data to display  ED Course  I have reviewed the triage vital signs and the nursing notes.  Pertinent labs & imaging results that were available during my care of the patient were reviewed by me and considered in my medical decision making (see chart for details).    MDM Rules/Calculators/A&P                           Alert 35 year old male no acute distress, nontoxic appearing.  Presents emergency department requesting a note for work allowing him to perform light duty due to his cervical radiculopathy.  Patient was informed that provider is unable to write a note detailing light duties.  Patient will need to follow-up with primary care provider or spine specialist.    Patient was noted to be hypertensive in the emergency department.  Patient has history of hypertension.  Recently started on Norvasc 5 mg.  Patient reports has been taking this medication as prescribed.  Patient's hypertension is asymptomatic.  Will increase Norvasc to 10 mg.  Patient has appointment with primary care provider on 10/14.  Discussed results, findings, treatment and follow up. Patient advised of return precautions. Patient verbalized understanding and agreed with plan.   Final Clinical Impression(s) / ED Diagnoses Final diagnoses:  Hypertension, unspecified type  Cervical radiculopathy    Rx / DC Orders ED Discharge Orders          Ordered    amLODipine (NORVASC) 10 MG tablet  Daily        03/03/21 1901             Berneice Heinrich 03/04/21 0000    Wynetta Fines, MD 03/06/21 2157

## 2021-03-03 NOTE — Discharge Instructions (Addendum)
You came to the emergency department requesting a note limiting your duties at work.  Fortunately we cannot provide you this in the emergency department.  While in emergency department your blood pressure was found to be elevated.  Please make sure that you are taking your medications as prescribed.  Please keep your appointment with your primary care doctor on 10/14.  Due to your elevated blood pressure here again in the emergency department I have increased your Norvasc medication to 10 mg.  Please take this once daily.  Please follow-up with your neurosurgeon.  Get help right away if you: Develop a severe headache or confusion. Have unusual weakness or numbness. Feel faint. Have severe pain in your chest or abdomen. Vomit repeatedly. Have trouble breathing.

## 2021-03-03 NOTE — ED Triage Notes (Signed)
Patient states he is here for a work note and states he can not get in to see his doctor yet. Patient states he is still having neck pain.

## 2021-03-06 ENCOUNTER — Other Ambulatory Visit: Payer: Self-pay

## 2021-03-07 ENCOUNTER — Ambulatory Visit (INDEPENDENT_AMBULATORY_CARE_PROVIDER_SITE_OTHER): Payer: BC Managed Care – PPO | Admitting: Family Medicine

## 2021-03-07 ENCOUNTER — Encounter: Payer: Self-pay | Admitting: Family Medicine

## 2021-03-07 VITALS — BP 124/80 | HR 86 | Temp 97.6°F | Ht 72.75 in | Wt 270.8 lb

## 2021-03-07 DIAGNOSIS — Z1159 Encounter for screening for other viral diseases: Secondary | ICD-10-CM

## 2021-03-07 DIAGNOSIS — Z13220 Encounter for screening for lipoid disorders: Secondary | ICD-10-CM

## 2021-03-07 DIAGNOSIS — Z131 Encounter for screening for diabetes mellitus: Secondary | ICD-10-CM

## 2021-03-07 DIAGNOSIS — M5412 Radiculopathy, cervical region: Secondary | ICD-10-CM

## 2021-03-07 DIAGNOSIS — I10 Essential (primary) hypertension: Secondary | ICD-10-CM

## 2021-03-07 DIAGNOSIS — E669 Obesity, unspecified: Secondary | ICD-10-CM | POA: Insufficient documentation

## 2021-03-07 NOTE — Progress Notes (Signed)
[facility]ARE [facility] [address] [place] [address] Dept: [phone] Dept Fax: [phone]  New Patient Office Visit  Subjective:    Patient ID: Eddie [name], male    DOB: 1985/10/21, 35 y.o..   MRN: [id number]  Chief Complaint  Patient presents with   Establish Care    NP- establish care. C/o having some tingling in hands and  RT shoulder pain x 2-3 weeks. Has taken Ibuprofen with some relief.   Declines flu shot today.     History of Present Illness:  Patient is in today to establish care. Eddie [name] was born in [place]. He attended about a year at [employer] after high school. 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 He is single and has no children. He denies tobacco or drug use. He occasionally drinks alcohol socially. He occasionally plays basketball, but admits he is not exercising regularly.  Eddie [name] was seen ion [facility] eon 9/27 related to a cervical radiculopathy. He was found at that visit to have a blood pressure of 198/134. He was started on amlodipine 5 mg. He followed up in the ED 2 days later showing some mild improvement. He returned to the ED on 10/10. His blood pressure was 210/112. His amlodipine dose was increased to 10 mg daily. He denies any headache or chest pain. He has started measuring his blood pressures daily at home. In the last several days, his systolics are in the 1`40-150 range and his diastolics in the 90s.  Eddie [name] has been doing stretching exercises for his neck and has seen gradual improvement of the neck symptoms.  Past Medical History: Patient Active Problem List   Diagnosis Date Noted   Essential hypertension 03/07/2021   Cervical radiculopathy 03/07/2021   Obesity (BMI 35.0-39.9 without comorbidity) 03/07/2021   History reviewed. No pertinent surgical history.  Family History  Problem Relation Age of Onset   Diabetes Father        Leg amputation   Stroke Maternal Uncle    Heart  disease Paternal Grandfather    Diabetes Paternal Grandfather    Hypertension Other        paternal side   Cancer Neg Hx    Outpatient Medications Prior to Visit  Medication Sig Dispense Refill   amLODipine (NORVASC) 10 MG tablet Take 0.5 tablets (5 mg total) by mouth daily for 14 days. 7 tablet 0   No facility-administered medications prior to visit.   No Known Allergies    Objective:   Today's Vitals   03/07/21 1411  BP: 124/80  Pulse: 86  Temp: 97.6 F (36.4 C)  TempSrc: Temporal  SpO2: 97%  Weight: 270 lb 12.8 oz (122.8 kg)  Height: 6' 0.75" (1.848 m)   Body mass index is 35.97 kg/m.   General: Well developed, well nourished. No acute distress. Neck: FROM. NO crepitance noted. Psych: Alert and oriented. Normal mood and affect.  Health Maintenance Due  Topic Date Due   COVID-19 Vaccine (1) Never done   Hepatitis C Screening  Never done   INFLUENZA VACCINE  12/23/2020   Lab Results CMP Latest Ref Rng & Units 02/18/2021 03/25/2012  Glucose 70 - 99 mg/dL 607(P) 93  BUN 6 - 20 mg/dL 12 18  Creatinine 7.10 - 1.24 mg/dL 6.26 9.48  Sodium 546 - 145 mmol/L 139 141  Potassium 3.5 - 5.1 mmol/L 3.0(L) 4.2  Chloride 98 - 111 mmol/L 98 105  CO2 22 - 32 mmol/L 27  26  Calcium 8.9 - 10.3 mg/dL 9.2 17.6  Total Protein 6.0 - 8.3 g/dL - 7.3  Total Bilirubin 0.3 - 1.2 mg/dL - 0.8  Alkaline Phos 39 - 117 U/L - 67  AST 0 - 37 U/L - 18  ALT 0 - 53 U/L - 20    CBC Latest Ref Rng & Units 02/18/2021 03/25/2012  WBC 4.0 - 10.5 K/uL 7.3 6.9  Hemoglobin 13.0 - 17.0 g/dL 16.0 73.7  Hematocrit 10.6 - 52.0 % 47.3 45.0  Platelets 150 - 400 K/uL 497(H) 425(H)     Assessment & Plan:   1. Essential hypertension Reviewed 2 ED records and urgent care notes and labs. I had a lengthy discussion with Eddie [name] about hypertension. We reviewed non-pharmacologic approaches to lowering his pressure. The BP today is much improved form his previous values. I will plant o continue him on amlodipine  for now. I will reassess his BP in 6 weeks.  - Basic metabolic panel; Future  2. Cervical radiculopathy Stable. Improving. Continue current physical approaches to relieving symptoms. I cautioned him against excessive ibuprofen use.  3. Encounter for hepatitis C screening test for low risk patient  - HCV Ab w Reflex to Quant PCR; Future  4. Screening for lipid disorders  - Lipid panel; Future  5. Screening for diabetes mellitus (DM)  - Hemoglobin A1c; Future  Loyola Mast, MD

## 2021-03-07 NOTE — Patient Instructions (Signed)

## 2021-03-13 ENCOUNTER — Other Ambulatory Visit (INDEPENDENT_AMBULATORY_CARE_PROVIDER_SITE_OTHER): Payer: BC Managed Care – PPO

## 2021-03-13 ENCOUNTER — Other Ambulatory Visit: Payer: Self-pay

## 2021-03-13 DIAGNOSIS — Z13220 Encounter for screening for lipoid disorders: Secondary | ICD-10-CM | POA: Diagnosis not present

## 2021-03-13 DIAGNOSIS — I10 Essential (primary) hypertension: Secondary | ICD-10-CM

## 2021-03-13 DIAGNOSIS — Z1159 Encounter for screening for other viral diseases: Secondary | ICD-10-CM | POA: Diagnosis not present

## 2021-03-13 DIAGNOSIS — Z131 Encounter for screening for diabetes mellitus: Secondary | ICD-10-CM | POA: Diagnosis not present

## 2021-03-13 LAB — LIPID PANEL
Cholesterol: 163 mg/dL (ref 0–200)
HDL: 36.6 mg/dL — ABNORMAL LOW (ref >39.00)
LDL Cholesterol: 104 mg/dL — ABNORMAL HIGH (ref 0–99)
NonHDL: 125.99
Total CHOL/HDL Ratio: 4
Triglycerides: 108 mg/dL (ref 0.0–149.0)
VLDL: 21.6 mg/dL (ref 0.0–40.0)

## 2021-03-13 LAB — BASIC METABOLIC PANEL
BUN: 11 mg/dL (ref 6–23)
CO2: 33 mEq/L — ABNORMAL HIGH (ref 19–32)
Calcium: 9.8 mg/dL (ref 8.4–10.5)
Chloride: 100 mEq/L (ref 96–112)
Creatinine, Ser: 0.9 mg/dL (ref 0.40–1.50)
GFR: 110.58 mL/min (ref >60.00)
Glucose, Bld: 103 mg/dL — ABNORMAL HIGH (ref 70–99)
Potassium: 3.5 mEq/L (ref 3.5–5.1)
Sodium: 140 mEq/L (ref 135–145)

## 2021-03-13 LAB — HEMOGLOBIN A1C: Hgb A1c MFr Bld: 5.7 % (ref 4.6–6.5)

## 2021-03-14 LAB — HCV AB W REFLEX TO QUANT PCR: HCV Ab: <0.1 s/co ratio (ref 0.0–0.9)

## 2021-03-14 LAB — HCV INTERPRETATION

## 2021-04-23 ENCOUNTER — Ambulatory Visit: Payer: BC Managed Care – PPO | Admitting: Family Medicine

## 2021-05-21 ENCOUNTER — Ambulatory Visit: Payer: BC Managed Care – PPO | Admitting: Family Medicine

## 2021-06-18 ENCOUNTER — Other Ambulatory Visit: Payer: Self-pay

## 2021-06-18 ENCOUNTER — Encounter: Payer: Self-pay | Admitting: Family Medicine

## 2021-06-18 ENCOUNTER — Ambulatory Visit: Payer: BC Managed Care – PPO | Admitting: Family Medicine

## 2021-06-18 VITALS — BP 170/102 | HR 75 | Temp 97.0°F | Ht 72.75 in | Wt 275.6 lb

## 2021-06-18 DIAGNOSIS — I10 Essential (primary) hypertension: Secondary | ICD-10-CM | POA: Diagnosis not present

## 2021-06-18 MED ORDER — AMLODIPINE BESYLATE 10 MG PO TABS: 10.0000 mg | ORAL_TABLET | Freq: Every day | ORAL | 3 refills | Status: DC

## 2021-06-18 NOTE — Progress Notes (Signed)
[facility] [address] [place] [address] Dept: [phone] Dept Fax: [phone]  Chronic Care Office Visit  Subjective:    Patient ID: Eddie [name], male    DOB: [birth year], 36 y.o..   MRN: [id number]  Chief Complaint  Patient presents with   Follow-up    6 wee f/u  HTN/meds.     History of Present Illness:  Patient is in today for reassessment of chronic medical issues.  Eddie [name] was diagnosed with hypertension this Fall, having been seen at Urgent Care on 9/27 related to a cervical radiculopathy. He was found at that visit to have a blood pressure of 198/134. He was started on amlodipine 5 mg. He followed up in the ED 2 days later showing some mild improvement. He returned to the ED on 10/10. His blood pressure was 210/112. His amlodipine dose was increased to 10 mg daily. Since his last visit, he notes he ran out of his medication. He denies any current headache or chest pain.  Past Medical History: Patient Active Problem List   Diagnosis Date Noted   Essential hypertension 03/07/2021   Cervical radiculopathy 03/07/2021   Obesity (BMI 35.0-39.9 without comorbidity) 03/07/2021   No past surgical history on file.  Family History  Problem Relation Age of Onset   Diabetes Father        Leg amputation   Stroke Maternal Uncle    Heart disease Paternal Grandfather    Diabetes Paternal Grandfather    Hypertension Other        paternal side   Cancer Neg Hx    Outpatient Medications Prior to Visit  Medication Sig Dispense Refill   amLODipine (NORVASC) 10 MG tablet Take 0.5 tablets (5 mg total) by mouth daily for 14 days. (Patient not taking: Reported on 06/18/2021) 7 tablet 0   No facility-administered medications prior to visit.   No Known Allergies    Objective:   Today's Vitals   06/18/21 1428 06/18/21 1432  BP: (!) 166/100 (!) 170/102  Pulse: 75   Temp: (!) 97 F (36.1 C)   TempSrc: Temporal    SpO2: 97%   Weight: 275 lb 9.6 oz (125 kg)   Height: 6' 0.75" (1.848 m)    Body mass index is 36.61 kg/m.   General: Well developed, well nourished. No acute distress. Psych: Alert and oriented. Normal mood and affect.  Health Maintenance Due  Topic Date Due   COVID-19 Vaccine (1) Never done   INFLUENZA VACCINE  12/23/2020   Lab Results Last lipids Lab Results  Component Value Date   CHOL 163 03/13/2021   HDL 36.60 (L) 03/13/2021   LDLCALC 104 (H) 03/13/2021   TRIG 108.0 03/13/2021   CHOLHDL 4 03/13/2021   BMP Latest Ref Rng & Units 03/13/2021 02/18/2021 03/25/2012  Glucose 70 - 99 mg/dL 885(O) 277(A) 93  BUN 6 - 23 mg/dL 11 12 18   Creatinine 0.40 - 1.50 mg/dL 1.28 7.86  Sodium 135 - 145 mEq/L 140 139 141  Potassium 3.5 - 5.1 mEq/L 3.5 3.0(L) 4.2  Chloride 96 - 112 mEq/L 100 98 105  CO2 19 - 32 mEq/L 33(H) 27 26  Calcium 8.4 - 10.5 mg/dL 9.8 9.2 7.67     Assessment & Plan:   1. Essential hypertension I will restart Eddie [name] on his blood pressure medication. I asked him to return in 2 months for reassessment.  - amLODipine (NORVASC) 10 MG tablet; Take 1 tablet (10 mg  total) by mouth daily for 14 days.  Dispense: 90 tablet; Refill: 3  *After the patient had left, I reviewed his EKG form his ED visit in 01/2021. This showed left atrial enlargement, LVH, and some t-wave abnormalities. I recommend we repeat this at his next visit. He may need a follow-up with cardiology for an echocardiogram.  Loyola Mast, MD

## 2021-07-02 ENCOUNTER — Telehealth: Payer: Self-pay | Admitting: Family Medicine

## 2021-07-02 NOTE — Telephone Encounter (Signed)
Please review and advise. Thanks. Dm/cma  

## 2021-07-02 NOTE — Telephone Encounter (Signed)
Pt is wanting  a 5mg  tablet verses the amLODipine (NORVASC) 10 MG tablet one he has now. He says sometimes he does not cut it right down the middle.   Pharmacy  [id number] [address] [place] [place] [address] [place]  [id number], [place] [address]  Phone:  [phone]  Fax:  [phone]  DEA #:  [id number]

## 2021-07-03 NOTE — Telephone Encounter (Signed)
Patient notified VIA phone to be taking 10 mg daily. He will go to the pharmacy and pick up the new Rx..  no further questions.  Dm/cma

## 2021-08-18 ENCOUNTER — Ambulatory Visit: Payer: BC Managed Care – PPO | Admitting: Family Medicine

## 2021-08-20 ENCOUNTER — Ambulatory Visit: Payer: BC Managed Care – PPO | Admitting: Family Medicine

## 2021-08-27 ENCOUNTER — Ambulatory Visit: Payer: BC Managed Care – PPO | Admitting: Family Medicine

## 2021-09-18 ENCOUNTER — Ambulatory Visit: Payer: BC Managed Care – PPO | Admitting: Family Medicine

## 2021-10-02 ENCOUNTER — Ambulatory Visit: Payer: BC Managed Care – PPO | Admitting: Family Medicine

## 2021-10-17 ENCOUNTER — Ambulatory Visit: Payer: BC Managed Care – PPO | Admitting: Family Medicine

## 2021-10-22 ENCOUNTER — Ambulatory Visit: Payer: BC Managed Care – PPO | Admitting: Family Medicine

## 2021-10-24 ENCOUNTER — Ambulatory Visit: Payer: BC Managed Care – PPO | Admitting: Family Medicine

## 2021-10-24 ENCOUNTER — Encounter: Payer: Self-pay | Admitting: Family Medicine

## 2021-10-24 VITALS — BP 144/86 | HR 78 | Temp 98.5°F | Wt 271.0 lb

## 2021-10-24 DIAGNOSIS — I10 Essential (primary) hypertension: Secondary | ICD-10-CM

## 2021-10-24 DIAGNOSIS — B084 Enteroviral vesicular stomatitis with exanthem: Secondary | ICD-10-CM

## 2021-10-24 MED ORDER — AMLODIPINE BESYLATE-VALSARTAN 10-160 MG PO TABS: 1.0000 | ORAL_TABLET | Freq: Every day | ORAL | 3 refills | Status: DC

## 2021-10-24 NOTE — Patient Instructions (Signed)
Hand, Foot, and Mouth Disease, Adult Hand, foot, and mouth disease is a common viral illness. It happens mainly in children who are younger than 5 years, but adolescents and adults can also get it. The illness can spread easily from person to person (is contagious) and often causes: Sores in the mouth. A rash on the hands and feet. Usually, this condition is not serious. Most people get better within 1-2 weeks. What are the causes? This illness is usually caused by a group of viruses called enteroviruses. A person is most contagious during the first week of the illness. The infection spreads through direct contact with: Discharge from the nose or throat of an infected person. Stool (feces) of an infected person. Surfaces that have been contaminated. What are the signs or symptoms? Symptoms of this condition include: Small sores in the mouth. These may cause pain. A rash on the hands and feet and sometimes on the buttocks. The rash may also occur on the arms, legs, or other areas of the body. The rash may look like small red bumps or sores and may have blisters. Fever. Sore throat. Body aches or headaches. Decreased appetite. How is this diagnosed? This condition is usually diagnosed based on: A physical exam. Your health care provider will look at your rash and mouth sores. In some cases, a stool sample or a throat swab may be taken to check for the virus or for other infections. How is this treated? In most cases, no treatment is needed. People usually get better within 2 weeks. To help relieve pain or fever, your health care provider may recommend over-the-counter medicines such as ibuprofen or acetaminophen. To help relieve discomfort from mouth sores, your health care provider may recommend using: Solutions that are rinsed in the mouth. Pain-relieving gel that is applied to the sores (topical gel). Antacid medicine. Follow these instructions at home: Managing pain and  discomfort  Rinse your mouth with a mixture of salt and water 3-4 times a day or as needed. To make salt water, completely dissolve -1 tsp (3-6 g) of salt in 1 cup (237 mL) of warm water. This can help to reduce pain from the mouth sores. To relieve discomfort when you are eating: Try combinations of foods to see what you can tolerate. Aim for a balanced diet. Eat soft foods. These may be easier to swallow. Avoid foods and drinks that are salty, spicy, or acidic. Avoid alcohol. Try cold food and drinks, such as water, milk, milkshakes, frozen ice pops, slushies, and sherbets. Low-calorie sports drinks are a good choice for staying hydrated. Relieving pain, itching, and discomfort in rash areas Keep cool and out of the sun. Sweating and feeling hot can make itching worse. Cool baths can be soothing. Add baking soda or dry oatmeal to the water to reduce itching. Do not bathe in hot water. Put cold, wet cloths (cold compresses) on itchy areas, as told by your health care provider. Use calamine lotion as recommended by your health care provider. This is an over-the-counter lotion that helps to relieve itchiness. Make sure you do not scratch or pick at the rash. To help prevent scratching: Keep your fingernails clean and cut short. Wear soft gloves or mittens while sleeping if scratching is a problem. General instructions  Take or apply over-the-counter and prescription medicines only as told by your health care provider. Wash your hands often with soap and water for at least 20 seconds. If soap and water are not available, use an   alcohol-based hand sanitizer. Clean and disinfect surfaces and shared items that you frequently touch. Stay away from work, schools, or other group settings during the first few days of the illness, or until your fever is gone for at least 24 hours. Return to your normal activities as told by your health care provider. Ask your health care provider what activities are  safe for you. Keep all follow-up visits. This is important. Contact a health care provider if: Your symptoms get worse or do not improve within 2 weeks. You have pain that does not get better with medicine. You have trouble swallowing. You develop sores or blisters on your lips or outside of your mouth. You have a fever for more than 3 days. Get help right away if: You develop signs of severe dehydration, such as: Decreased urination. This means urinating only very small amounts or fewer than 3 times in a 24-hour period. Urine that is very dark. Dry mouth, tongue, or lips. Decreased tears or sunken eyes. Dry skin. Rapid breathing. Decreased activity or being very sleepy. Pale skin. Your fingertips take longer than 2 seconds to turn pink after a gentle squeeze. Weight loss. You have a severe headache. You have a stiff neck. You have changes in your behavior. You have chest pain or trouble breathing. These symptoms may represent a serious problem that is an emergency. Do not wait to see if the symptoms will go away. Get medical help right away. Call your local emergency services (911 in the [place] Do not drive yourself to the hospital. Summary Hand, foot, and mouth disease is a common viral illness. This disease can spread easily from person to person (is contagious). The illness often causes sores in the mouth, a rash on the hands and feet, a fever, and a sore throat. Typically, no treatment is needed for this condition. People usually get better within 2 weeks. Get help right away if you develop signs of severe dehydration. This information is not intended to replace advice given to you by your health care provider. Make sure you discuss any questions you have with your health care provider. Document Revised: 02/12/2020 Document Reviewed: 02/12/2020 Elsevier Patient Education  2023 Elsevier Inc.  

## 2021-10-24 NOTE — Progress Notes (Signed)
[facility] [address] [place] [address] Dept: [phone] Dept Fax: [phone]  Office Visit  Subjective:    Patient ID: Eddie [name], male    DOB: 1985-11-29, 36 y.o..   MRN: [id number]  Chief Complaint  Patient presents with   Follow-up    Follow up on HTN. He is fasting. Patient needs refill on amlodipine. Patient states that after eating a mango Wednesday and experienced hives on his face and hands.     History of Present Illness:  Patient is in today for reassessment of his high blood pressure. Eddie [name] was diagnosed with hypertension this Fall, having been seen at Urgent Care on 9/27 related to a cervical radiculopathy. He was found at that visit to have a blood pressure of 198/134. He was started on amlodipine 5 mg. He followed up in the ED 2 days later showing some mild improvement. He returned to the ED on 10/10. His blood pressure was 210/112. His amlodipine dose was increased to 10 mg daily. He ran out of his meds, so was not on treatment at the time of his last visit on 1/25. I started the amlodipine back and asked him to follow up in March. He states he has been using his medication and denies any current symptoms related to this.  Over the past few days, he does note that he has a rash on his hands with some tingling of the hands and feet. He relates the appearance of the rash to having eaten mangoes that same day. He thought his rash might be hives. However, he has not noted any itching. He denies any oral lesions. However, he notes his nephew was recently diagnosed with Hand, Foot, and Mouth disease.   Past Medical History: Patient Active Problem List   Diagnosis Date Noted   Essential hypertension 03/07/2021   Cervical radiculopathy 03/07/2021   Obesity (BMI 35.0-39.9 without comorbidity) 03/07/2021    No past surgical history on file.  Family History  Problem Relation Age of Onset   Diabetes  Father        Leg amputation   Stroke Maternal Uncle    Heart disease Paternal Grandfather    Diabetes Paternal Grandfather    Hypertension Other        paternal side   Cancer Neg Hx    Outpatient Medications Prior to Visit  Medication Sig Dispense Refill   amLODipine (NORVASC) 10 MG tablet Take 1 tablet (10 mg total) by mouth daily for 14 days. 90 tablet 3   No facility-administered medications prior to visit.   No Known Allergies    Objective:   Today's Vitals   10/24/21 1401  BP: (!) 144/86  Pulse: 78  Temp: 98.5 F (36.9 C)  TempSrc: Temporal  SpO2: 98%  Weight: 271 lb (122.9 kg)   Body mass index is 36 kg/m.   General: Well developed, well nourished. No acute distress. Mouth: No intraoral lesions noted. Skin: Warm and dry. There are diffuse small erythematous lesions on the palm of both hands and palmar   aspect of the fingers. Each lesion has a pale central area. Psych: Alert and oriented. Normal mood and affect.  Health Maintenance Due  Topic Date Due   COVID-19 Vaccine (1) Never done     Assessment & Plan:   1. Essential hypertension Blood pressure remains high. I recommend we switch him to Exforge to see if we can achieve better control.  - amLODipine-valsartan (EXFORGE) 10-160 MG  tablet; Take 1 tablet by mouth daily.  Dispense: 30 tablet; Refill: 3  2. 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 I reassured him that this is benign and should resolve in 1-2 weeks. This does not appear to be hives, so he should feel safe in continuing to eat mangoes if he would like.  Return in about 6 weeks (around 12/05/2021) for Reassessment.   Loyola Mast, MD

## 2021-12-05 ENCOUNTER — Ambulatory Visit: Payer: BC Managed Care – PPO | Admitting: Family Medicine

## 2021-12-19 ENCOUNTER — Ambulatory Visit: Payer: BC Managed Care – PPO | Admitting: Family Medicine

## 2022-01-02 ENCOUNTER — Ambulatory Visit: Payer: BC Managed Care – PPO | Admitting: Family Medicine

## 2022-01-16 ENCOUNTER — Ambulatory Visit: Payer: BC Managed Care – PPO | Admitting: Family Medicine

## 2022-01-22 ENCOUNTER — Other Ambulatory Visit: Payer: Self-pay | Admitting: Family Medicine

## 2022-01-22 DIAGNOSIS — I10 Essential (primary) hypertension: Secondary | ICD-10-CM

## 2022-01-30 ENCOUNTER — Ambulatory Visit: Payer: BC Managed Care – PPO | Admitting: Family Medicine

## 2022-02-13 ENCOUNTER — Ambulatory Visit: Payer: BC Managed Care – PPO | Admitting: Family Medicine

## 2022-02-19 ENCOUNTER — Ambulatory Visit: Payer: BC Managed Care – PPO | Admitting: Family Medicine

## 2022-03-06 ENCOUNTER — Ambulatory Visit: Payer: BC Managed Care – PPO | Admitting: Family Medicine

## 2022-03-27 ENCOUNTER — Ambulatory Visit: Payer: BC Managed Care – PPO | Admitting: Family Medicine

## 2022-04-15 ENCOUNTER — Ambulatory Visit: Payer: BC Managed Care – PPO | Admitting: Family Medicine

## 2022-04-22 ENCOUNTER — Ambulatory Visit: Payer: BC Managed Care – PPO | Admitting: Family Medicine

## 2022-05-06 ENCOUNTER — Ambulatory Visit: Payer: BC Managed Care – PPO | Admitting: Family Medicine

## 2022-05-22 ENCOUNTER — Ambulatory Visit: Payer: BC Managed Care – PPO | Admitting: Family Medicine

## 2022-05-27 ENCOUNTER — Ambulatory Visit: Payer: BC Managed Care – PPO | Admitting: Family Medicine

## 2022-05-29 ENCOUNTER — Ambulatory Visit: Payer: BC Managed Care – PPO | Admitting: Family Medicine

## 2022-06-17 ENCOUNTER — Ambulatory Visit: Payer: BC Managed Care – PPO | Admitting: Family Medicine

## 2022-06-23 ENCOUNTER — Encounter: Payer: Self-pay | Admitting: Family Medicine

## 2022-06-23 ENCOUNTER — Ambulatory Visit (INDEPENDENT_AMBULATORY_CARE_PROVIDER_SITE_OTHER): Payer: BC Managed Care – PPO | Admitting: Family Medicine

## 2022-06-23 VITALS — BP 194/124 | HR 84 | Temp 97.4°F | Ht 72.75 in | Wt 282.4 lb

## 2022-06-23 DIAGNOSIS — I10 Essential (primary) hypertension: Secondary | ICD-10-CM | POA: Diagnosis not present

## 2022-06-23 MED ORDER — AMLODIPINE BESYLATE-VALSARTAN 10-320 MG PO TABS: 1.0000 | ORAL_TABLET | Freq: Every day | ORAL | 3 refills | Status: DC

## 2022-06-23 NOTE — Progress Notes (Signed)
  [facility] [address] [place] [address] Dept: [phone] Dept Fax: [phone]  Chronic Care Office Visit  Subjective:    Patient ID: Eddie [name], male    DOB: 11-14-1985, 37 y.o..   MRN: [id number]  Chief Complaint  Patient presents with   Follow-up    FU wth HTN,  no concerns    History of Present Illness:  Patient is in today for reassessment of chronic medical issues.  Eddie [name] was diagnosed with hypertension in the Fall of 2022. He was initiated on amlodipine, which was later increased to 10 mg daily. He followed up soon after to establish care. We have gradually stepped up his therapy to be on amlodipine-valsartan (Exforge) 10-160 mg daily. His last visit was 7 months ago. He notes that he has been feeling fine and has not noted any issues. He is using a wrist-type BP cuff at home. He states that on this, he tends to have systolic Bps in the 132-440N. He has not been consistent in taking his BP medicine daily. However, he notes he has been taking this recently.  Past Medical History: Patient Active Problem List   Diagnosis Date Noted   Essential hypertension 03/07/2021   Cervical radiculopathy 03/07/2021   Obesity (BMI 35.0-39.9 without comorbidity) 03/07/2021   No past surgical history on file.  Family History  Problem Relation Age of Onset   Diabetes Father        Leg amputation   Stroke Maternal Uncle    Heart disease Paternal Grandfather    Diabetes Paternal Grandfather    Hypertension Other        paternal side   Cancer Neg Hx    Outpatient Medications Prior to Visit  Medication Sig Dispense Refill   amLODipine-valsartan (EXFORGE) 10-160 MG tablet TAKE 1 TABLET BY MOUTH EVERY DAY 90 tablet 1   No facility-administered medications prior to visit.   No Known Allergies    Objective:   Today's Vitals   06/23/22 1320  BP: (!) 194/124  Pulse: 84  Temp: (!) 97.4 F (36.3 C)  TempSrc:  Tympanic  SpO2: 97%  Weight: 282 lb 6.4 oz (128.1 kg)  Height: 6' 0.75" (1.848 m)   Body mass index is 37.51 kg/m.   General: Well developed, well nourished. No acute distress. Psych: Alert and oriented. Normal mood and affect.  Health Maintenance Due  Topic Date Due   DTaP/Tdap/Td (2 - Td or Tdap) 03/25/2022     Assessment & Plan:   Problem List Items Addressed This Visit       Cardiovascular and Mediastinum   Essential hypertension - Primary    Eddie [name] continues to have significantly high BP. I urged him to be consistent in taking his medication. I also asked him to see me more regularly, so we can monitor and adjust his therapy until we have his BP in better control. I will increase his Exforge to 10-320 mg. We provided a BP log sheet. I asked him to monitor this at home. I will see him back in 1 month to reassess and review his logs.      Relevant Medications   amLODipine-valsartan (EXFORGE) 10-320 MG tablet    Return in about 4 weeks (around 07/21/2022) for Reassessment.   Haydee Salter, MD

## 2022-06-23 NOTE — Patient Instructions (Signed)
Hypertension, Adult High blood pressure (hypertension) is when the force of blood pumping through the arteries is too strong. The arteries are the blood vessels that carry blood from the heart throughout the body. Hypertension forces the heart to work harder to pump blood and may cause arteries to become narrow or stiff. Untreated or uncontrolled hypertension can lead to a heart attack, heart failure, a stroke, kidney disease, and other problems. A blood pressure reading consists of a higher number over a lower number. Ideally, your blood pressure should be below 120/80. The first ("top") number is called the systolic pressure. It is a measure of the pressure in your arteries as your heart beats. The second ("bottom") number is called the diastolic pressure. It is a measure of the pressure in your arteries as the heart relaxes. What are the causes? The exact cause of this condition is not known. There are some conditions that result in high blood pressure. What increases the risk? Certain factors may make you more likely to develop high blood pressure. Some of these risk factors are under your control, including: Smoking. Not getting enough exercise or physical activity. Being overweight. Having too much fat, sugar, calories, or salt (sodium) in your diet. Drinking too much alcohol. Other risk factors include: Having a personal history of heart disease, diabetes, high cholesterol, or kidney disease. Stress. Having a family history of high blood pressure and high cholesterol. Having obstructive sleep apnea. Age. The risk increases with age. What are the signs or symptoms? High blood pressure may not cause symptoms. Very high blood pressure (hypertensive crisis) may cause: Headache. Fast or irregular heartbeats (palpitations). Shortness of breath. Nosebleed. Nausea and vomiting. Vision changes. Severe chest pain, dizziness, and seizures. How is this diagnosed? This condition is diagnosed by  measuring your blood pressure while you are seated, with your arm resting on a flat surface, your legs uncrossed, and your feet flat on the floor. The cuff of the blood pressure monitor will be placed directly against the skin of your upper arm at the level of your heart. Blood pressure should be measured at least twice using the same arm. Certain conditions can cause a difference in blood pressure between your right and left arms. If you have a high blood pressure reading during one visit or you have normal blood pressure with other risk factors, you may be asked to: Return on a different day to have your blood pressure checked again. Monitor your blood pressure at home for 1 week or longer. If you are diagnosed with hypertension, you may have other blood or imaging tests to help your health care provider understand your overall risk for other conditions. How is this treated? This condition is treated by making healthy lifestyle changes, such as eating healthy foods, exercising more, and reducing your alcohol intake. You may be referred for counseling on a healthy diet and physical activity. Your health care provider may prescribe medicine if lifestyle changes are not enough to get your blood pressure under control and if: Your systolic blood pressure is above 130. Your diastolic blood pressure is above 80. Your personal target blood pressure may vary depending on your medical conditions, your age, and other factors. Follow these instructions at home: Eating and drinking  Eat a diet that is high in fiber and potassium, and low in sodium, added sugar, and fat. An example of this eating plan is called the DASH diet. DASH stands for Dietary Approaches to Stop Hypertension. To eat this way: Eat   plenty of fresh fruits and vegetables. Try to fill one half of your plate at each meal with fruits and vegetables. Eat whole grains, such as whole-wheat pasta, brown rice, or whole-grain bread. Fill about one  fourth of your plate with whole grains. Eat or drink low-fat dairy products, such as skim milk or low-fat yogurt. Avoid fatty cuts of meat, processed or cured meats, and poultry with skin. Fill about one fourth of your plate with lean proteins, such as fish, chicken without skin, beans, eggs, or tofu. Avoid pre-made and processed foods. These tend to be higher in sodium, added sugar, and fat. Reduce your daily sodium intake. Many people with hypertension should eat less than 1,500 mg of sodium a day. Do not drink alcohol if: Your health care provider tells you not to drink. You are pregnant, may be pregnant, or are planning to become pregnant. If you drink alcohol: Limit how much you have to: 0-1 drink a day for women. 0-2 drinks a day for men. Know how much alcohol is in your drink. In the [place] one drink equals one 12 oz bottle of beer (355 mL), one 5 oz glass of wine (148 mL), or one 1 oz glass of hard liquor (44 mL). Lifestyle  Work with your health care provider to maintain a healthy body weight or to lose weight. Ask what an ideal weight is for you. Get at least 30 minutes of exercise that causes your heart to beat faster (aerobic exercise) most days of the week. Activities may include walking, swimming, or biking. Include exercise to strengthen your muscles (resistance exercise), such as Pilates or lifting weights, as part of your weekly exercise routine. Try to do these types of exercises for 30 minutes at least 3 days a week. Do not use any products that contain nicotine or tobacco. These products include cigarettes, chewing tobacco, and vaping devices, such as e-cigarettes. If you need help quitting, ask your health care provider. Monitor your blood pressure at home as told by your health care provider. Keep all follow-up visits. This is important. Medicines Take over-the-counter and prescription medicines only as told by your health care provider. Follow directions carefully. Blood  pressure medicines must be taken as prescribed. Do not skip doses of blood pressure medicine. Doing this puts you at risk for problems and can make the medicine less effective. Ask your health care provider about side effects or reactions to medicines that you should watch for. Contact a health care provider if you: Think you are having a reaction to a medicine you are taking. Have headaches that keep coming back (recurring). Feel dizzy. Have swelling in your ankles. Have trouble with your vision. Get help right away if you: Develop a severe headache or confusion. Have unusual weakness or numbness. Feel faint. Have severe pain in your chest or abdomen. Vomit repeatedly. Have trouble breathing. These symptoms may be an emergency. Get help right away. Call 911. Do not wait to see if the symptoms will go away. Do not drive yourself to the hospital. Summary Hypertension is when the force of blood pumping through your arteries is too strong. If this condition is not controlled, it may put you at risk for serious complications. Your personal target blood pressure may vary depending on your medical conditions, your age, and other factors. For most people, a normal blood pressure is less than 120/80. Hypertension is treated with lifestyle changes, medicines, or a combination of both. Lifestyle changes include losing weight, eating a healthy,   low-sodium diet, exercising more, and limiting alcohol. This information is not intended to replace advice given to you by your health care provider. Make sure you discuss any questions you have with your health care provider. Document Revised: 03/18/2021 Document Reviewed: 03/18/2021 Elsevier Patient Education  2023 [employer] Inc[employer]  

## 2022-06-23 NOTE — Assessment & Plan Note (Signed)
Eddie [name] continues to have significantly high BP. I urged him to be consistent in taking his medication. I also asked him to see me more regularly, so we can monitor and adjust his therapy until we have his BP in better control. I will increase his Exforge to 10-320 mg. We provided a BP log sheet. I asked him to monitor this at home. I will see him back in 1 month to reassess and review his logs.

## 2022-07-21 ENCOUNTER — Ambulatory Visit: Payer: BC Managed Care – PPO | Admitting: Family Medicine

## 2022-07-22 ENCOUNTER — Ambulatory Visit: Payer: BC Managed Care – PPO | Admitting: Family Medicine

## 2022-07-29 ENCOUNTER — Encounter: Payer: Self-pay | Admitting: Family Medicine

## 2022-07-29 ENCOUNTER — Ambulatory Visit: Payer: BC Managed Care – PPO | Admitting: Family Medicine

## 2022-07-29 VITALS — BP 146/94 | HR 80 | Temp 98.6°F | Ht 72.75 in | Wt 282.2 lb

## 2022-07-29 DIAGNOSIS — H538 Other visual disturbances: Secondary | ICD-10-CM

## 2022-07-29 DIAGNOSIS — I10 Essential (primary) hypertension: Secondary | ICD-10-CM

## 2022-07-29 MED ORDER — HYDROCHLOROTHIAZIDE 25 MG PO TABS: 25.0000 mg | ORAL_TABLET | Freq: Every day | ORAL | 3 refills | Status: DC

## 2022-07-29 NOTE — Assessment & Plan Note (Signed)
Blood pressure is improved from his last office visit. I recommend we add a diuretic to his regimen. He will continue Exforge 10-320 mg daily and add HCTZ 25 mg daily. He should continue to check his BP at home and I will reassess him in 1 month.

## 2022-07-29 NOTE — Progress Notes (Signed)
  [facility] [address] [place] [address] Dept: [phone] Dept Fax: [phone]  Chronic Care Office Visit  Subjective:    Patient ID: Eddie [name], male    DOB: [birth year], 37 y.o..   MRN: [id number]  Chief Complaint  Patient presents with   Medical Management of Chronic Issues    F/u HTN. Average BP 128/88- 157/94.     History of Present Illness:  Patient is in today for reassessment of chronic medical issues.  Mr. Kulich was diagnosed with hypertension in the Fall of 2022. He was initiated on amlodipine, which was later increased to 10 mg daily. He followed up soon after to establish care. We have gradually stepped up his therapy to be on amlodipine-valsartan (Exforge) 10-320 mg daily. He is checking his BP at home. His 7-day morning average is 143/93. He does report a day a week or so ago when he developed some blurring of his vision. This last for about 24 hours.  Past Medical History: Patient Active Problem List   Diagnosis Date Noted   Essential hypertension 03/07/2021   Cervical radiculopathy 03/07/2021   Obesity (BMI 35.0-39.9 without comorbidity) 03/07/2021   History reviewed. No pertinent surgical history. Family History  Problem Relation Age of Onset   Diabetes Father        Leg amputation   Stroke Maternal Uncle    Heart disease Paternal Grandfather    Diabetes Paternal Grandfather    Hypertension Other        paternal side   Cancer Neg Hx    Outpatient Medications Prior to Visit  Medication Sig Dispense Refill   amLODipine-valsartan (EXFORGE) 10-320 MG tablet Take 1 tablet by mouth daily. 90 tablet 3   No facility-administered medications prior to visit.   No Known Allergies Objective:   Today's Vitals   07/29/22 1341 07/29/22 1354  BP: (!) 147/94 (!) 146/94  Pulse: 80   Temp: 98.6 F (37 C)   TempSrc: Temporal   SpO2: 96%   Weight: 282 lb 3.2 oz (128 kg)   Height: 6' 0.75" (1.848  m)    Body mass index is 37.49 kg/m.   General: Well developed, well nourished. No acute distress. Psych: Alert and oriented. Normal mood and affect.  Health Maintenance Due  Topic Date Due   DTaP/Tdap/Td (2 - Td or Tdap) 03/25/2022     Assessment & Plan:   Problem List Items Addressed This Visit       Cardiovascular and Mediastinum   Essential hypertension - Primary    Blood pressure is improved from his last office visit. I recommend we add a diuretic to his regimen. He will continue Exforge 10-320 mg daily and add HCTZ 25 mg daily. He should continue to check his BP at home and I will reassess him in 1 month.      Relevant Medications   hydrochlorothiazide (HYDRODIURIL) 25 MG tablet   Other Relevant Orders   Basic metabolic panel   Other Visit Diagnoses     Blurry vision       Etiology unclear. I will check labs to screen for diabetes.   Relevant Orders   Basic metabolic panel   Hemoglobin A1c       Return in about 4 weeks (around 08/26/2022) for Reassessment.   Haydee Salter, MD

## 2022-07-30 LAB — BASIC METABOLIC PANEL
BUN: 10 mg/dL (ref 6–23)
CO2: 31 mEq/L (ref 19–32)
Calcium: 9.9 mg/dL (ref 8.4–10.5)
Chloride: 101 mEq/L (ref 96–112)
Creatinine, Ser: 0.88 mg/dL (ref 0.40–1.50)
GFR: 110.26 mL/min (ref >60.00)
Glucose, Bld: 91 mg/dL (ref 70–99)
Potassium: 3.8 mEq/L (ref 3.5–5.1)
Sodium: 141 mEq/L (ref 135–145)

## 2022-07-30 LAB — HEMOGLOBIN A1C: Hgb A1c MFr Bld: 6.1 % (ref 4.6–6.5)

## 2022-08-26 ENCOUNTER — Ambulatory Visit: Payer: BC Managed Care – PPO | Admitting: Family Medicine

## 2022-09-02 ENCOUNTER — Encounter: Payer: Self-pay | Admitting: Family Medicine

## 2022-09-02 ENCOUNTER — Ambulatory Visit: Payer: BC Managed Care – PPO | Admitting: Family Medicine

## 2022-09-02 VITALS — BP 138/82 | HR 94 | Temp 97.9°F | Wt 285.6 lb

## 2022-09-02 DIAGNOSIS — R7303 Prediabetes: Secondary | ICD-10-CM | POA: Insufficient documentation

## 2022-09-02 DIAGNOSIS — I10 Essential (primary) hypertension: Secondary | ICD-10-CM

## 2022-09-02 MED ORDER — CHLORTHALIDONE 25 MG PO TABS: 25.0000 mg | ORAL_TABLET | Freq: Every day | ORAL | 5 refills | Status: DC

## 2022-09-02 NOTE — Progress Notes (Signed)
[facility] [address] [place] [address] Dept: [phone] Dept Fax: [phone]  Chronic Care Office Visit  Subjective:    Patient ID: Eddie [name], male    DOB: [birth year], 37 y.o..   MRN: [id number]  Chief Complaint  Patient presents with   Medical Management of Chronic Issues    4 week f/u HTN.  Average BP at home 149/94 - 120/75.    History of Present Illness:  Patient is in today for reassessment of chronic medical issues.  Eddie [name] was diagnosed with hypertension in the Fall of 2022. He was initiated on amlodipine, which was later increased to 10 mg daily. We have gradually stepped up his therapy to be on amlodipine-valsartan (Exforge) 10-320 mg daily. At his last visit, we added HCTZ 25 mg daily. He is checking his BP at home. His 7-day morning average is 127/81.   Past Medical History: Patient Active Problem List   Diagnosis Date Noted   Essential hypertension 03/07/2021   Cervical radiculopathy 03/07/2021   Obesity (BMI 35.0-39.9 without comorbidity) 03/07/2021   History reviewed. No pertinent surgical history. Family History  Problem Relation Age of Onset   Diabetes Father        Leg amputation   Stroke Maternal Uncle    Heart disease Paternal Grandfather    Diabetes Paternal Grandfather    Hypertension Other        paternal side   Cancer Neg Hx    Outpatient Medications Prior to Visit  Medication Sig Dispense Refill   amLODipine-valsartan (EXFORGE) 10-320 MG tablet Take 1 tablet by mouth daily. 90 tablet 3   hydrochlorothiazide (HYDRODIURIL) 25 MG tablet Take 1 tablet (25 mg total) by mouth daily. 90 tablet 3   No facility-administered medications prior to visit.   No Known Allergies Objective:   Today's Vitals   09/02/22 1350  BP: 138/82  Pulse: 94  Temp: 97.9 F (36.6 C)  TempSrc: Temporal  SpO2: 96%  Weight: 285 lb 9.6 oz (129.5 kg)   Body mass index is 37.94 kg/m.    General: Well developed, well nourished. No acute distress. Psych: Alert and oriented. Normal mood and affect.  Health Maintenance Due  Topic Date Due   DTaP/Tdap/Td (2 - Td or Tdap) 03/25/2022   Lab Results    Latest Ref Rng & Units 07/29/2022    2:11 PM 03/13/2021    1:52 PM 02/18/2021    2:09 PM  CMP  Glucose 70 - 99 mg/dL 91  545  625   BUN 6 - 23 mg/dL 10  11  12    Creatinine 0.40 - 1.50 mg/dL 6.38  9.37  3.42   Sodium 135 - 145 mEq/L 141  140  139   Potassium 3.5 - 5.1 mEq/L 3.8  3.5  3.0   Chloride 96 - 112 mEq/L 101  100  98   CO2 19 - 32 mEq/L 31  33  27   Calcium 8.4 - 10.5 mg/dL 9.9  9.8  9.2    Last hemoglobin A1c Lab Results  Component Value Date   HGBA1C 6.1 07/29/2022      Assessment & Plan:   Problem List Items Addressed This Visit       Cardiovascular and Mediastinum   Essential hypertension - Primary    Blood pressure is further improved from his last office visit. He will continue Exforge 10-320 mg daily and switch from HCTZ to chlorthalidone 25 mg daily. He should continue  to check his BP at home and I will reassess him in 6 weeks.      Relevant Medications   chlorthalidone (HYGROTON) 25 MG tablet   Return in about 6 weeks (around 10/14/2022) for Reassessment.   Loyola Mast, MD

## 2022-09-02 NOTE — Assessment & Plan Note (Signed)
Blood pressure is further improved from his last office visit. He will continue Exforge 10-320 mg daily and switch from HCTZ to chlorthalidone 25 mg daily. He should continue to check his BP at home and I will reassess him in 6 weeks.

## 2022-10-14 ENCOUNTER — Ambulatory Visit: Payer: BC Managed Care – PPO | Admitting: Family Medicine

## 2022-10-23 ENCOUNTER — Encounter: Payer: Self-pay | Admitting: Family Medicine

## 2022-10-23 ENCOUNTER — Ambulatory Visit: Payer: BC Managed Care – PPO | Admitting: Family Medicine

## 2022-10-23 VITALS — BP 130/82 | HR 86 | Temp 98.2°F | Ht 72.75 in | Wt 282.8 lb

## 2022-10-23 DIAGNOSIS — I10 Essential (primary) hypertension: Secondary | ICD-10-CM | POA: Diagnosis not present

## 2022-10-23 DIAGNOSIS — E876 Hypokalemia: Secondary | ICD-10-CM

## 2022-10-23 DIAGNOSIS — Z23 Encounter for immunization: Secondary | ICD-10-CM | POA: Diagnosis not present

## 2022-10-23 NOTE — Progress Notes (Signed)
  [facility] [address] [place] [address] Dept: [phone] Dept Fax: [phone]  Chronic Care Office Visit  Subjective:    Patient ID: Eddie [name], male    DOB: [birth year], 37 y.o..   MRN: [id number]  Chief Complaint  Patient presents with   Medical Management of Chronic Issues    6 week f/u BP.  Average BP at 118/76- 139/86   History of Present Illness:  Patient is in today for reassessment of chronic medical issues.  Mr. Farrand was diagnosed with hypertension in the Fall of 2022. He was initiated on amlodipine, which was later increased to 10 mg daily. We have gradually stepped up his therapy to be on amlodipine-valsartan (Exforge) 10-320 mg daily and chlorthalidone 25 mg daily. he is tolerating this without complaint. His 7-day average at home is 127/75.  Past Medical History: Patient Active Problem List   Diagnosis Date Noted   Prediabetes 09/02/2022   Essential hypertension 03/07/2021   Cervical radiculopathy 03/07/2021   Obesity (BMI 35.0-39.9 without comorbidity) 03/07/2021   History reviewed. No pertinent surgical history. Family History  Problem Relation Age of Onset   Diabetes Father        Leg amputation   Stroke Maternal Uncle    Heart disease Paternal Grandfather    Diabetes Paternal Grandfather    Hypertension Other        paternal side   Cancer Neg Hx    Outpatient Medications Prior to Visit  Medication Sig Dispense Refill   amLODipine-valsartan (EXFORGE) 10-320 MG tablet Take 1 tablet by mouth daily. 90 tablet 3   chlorthalidone (HYGROTON) 25 MG tablet Take 1 tablet (25 mg total) by mouth daily. 30 tablet 5   No facility-administered medications prior to visit.   No Known Allergies Objective:   Today's Vitals   10/23/22 1323  BP: 130/82  Pulse: 86  Temp: 98.2 F (36.8 C)  TempSrc: Temporal  SpO2: 97%  Weight: 282 lb 12.8 oz (128.3 kg)  Height: 6' 0.75" (1.848 m)   Body mass  index is 37.57 kg/m.   General: Well developed, well nourished. No acute distress. Psych: Alert and oriented. Normal mood and affect.  Health Maintenance Due  Topic Date Due   DTaP/Tdap/Td (2 - Td or Tdap) 03/25/2022   EKG: Normal sinus rhythm (rate= 74). Non-specific T-wave flattening in V5-V6 and limb leads.    Assessment & Plan:   Problem List Items Addressed This Visit       Cardiovascular and Mediastinum   Essential hypertension - Primary    Blood pressure is adequate control. I had reviewed his prior EKG from 2022, which did show left ventricular strain. On repeat today, his EKG is very normal. Continue Exforge 10-320 mg daily and chlorthalidone 25 mg daily. He should continue to check his BP at home and I will reassess him in 3 months.      Relevant Orders   Basic metabolic panel   EKG 12-Lead (Completed)   Other Visit Diagnoses     Need for Td vaccine       Relevant Orders   Td vaccine greater than or equal to 7yo preservative free IM       Return in about 3 months (around 01/23/2023) for Reassessment.   Loyola Mast, MD

## 2022-10-23 NOTE — Assessment & Plan Note (Signed)
Blood pressure is adequate control. I had reviewed his prior EKG from 2022, which did show left ventricular strain. On repeat today, his EKG is very normal. Continue Exforge 10-320 mg daily and chlorthalidone 25 mg daily. He should continue to check his BP at home and I will reassess him in 3 months.

## 2022-10-24 LAB — BASIC METABOLIC PANEL
BUN/Creatinine Ratio: 18 (ref 9–20)
BUN: 17 mg/dL (ref 6–20)
CO2: 27 mmol/L (ref 20–29)
Calcium: 10.1 mg/dL (ref 8.7–10.2)
Chloride: 97 mmol/L (ref 96–106)
Creatinine, Ser: 0.93 mg/dL (ref 0.76–1.27)
Glucose: 102 mg/dL — ABNORMAL HIGH (ref 70–99)
Potassium: 3.2 mmol/L — ABNORMAL LOW (ref 3.5–5.2)
Sodium: 142 mmol/L (ref 134–144)
eGFR: 108 mL/min/{1.73_m2} (ref >59)

## 2022-10-26 MED ORDER — POTASSIUM CHLORIDE CRYS ER 20 MEQ PO TBCR: 20.0000 meq | EXTENDED_RELEASE_TABLET | Freq: Every day | ORAL | 3 refills | Status: DC

## 2022-10-26 NOTE — Addendum Note (Signed)
Addended by: Loyola Mast on: 10/26/2022 08:10 AM   Modules accepted: Orders

## 2022-12-23 ENCOUNTER — Encounter (INDEPENDENT_AMBULATORY_CARE_PROVIDER_SITE_OTHER): Payer: Self-pay

## 2023-01-05 IMAGING — CR DG CHEST 2V
2 series · 2 of 2 positions shown · non-contrast
Comparison: No priors.

CLINICAL DATA: 35-year-old male with history of neck pain radiating
into the right arm.

EXAM:
CHEST - 2 VIEW

[w chest pa]
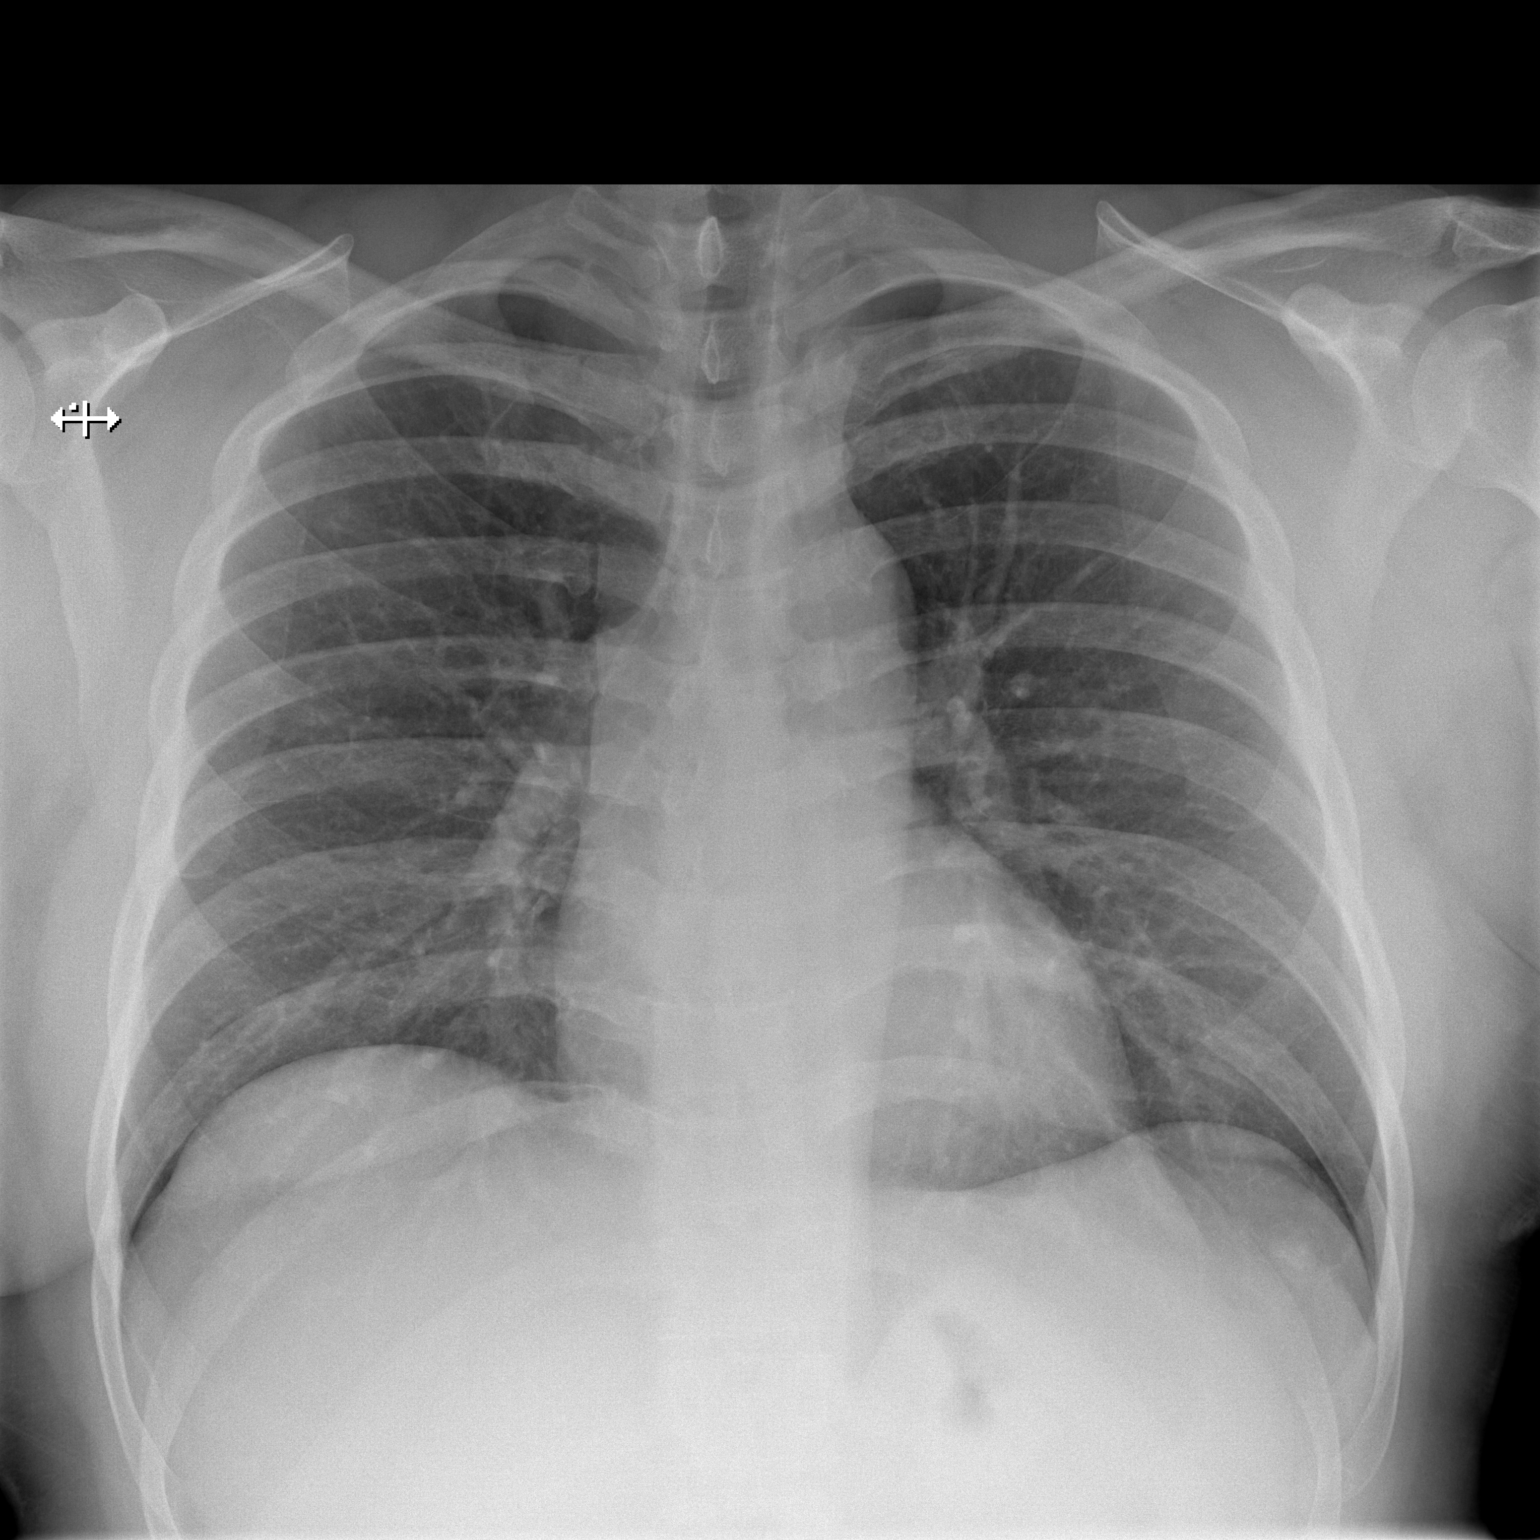

[w chest lat]
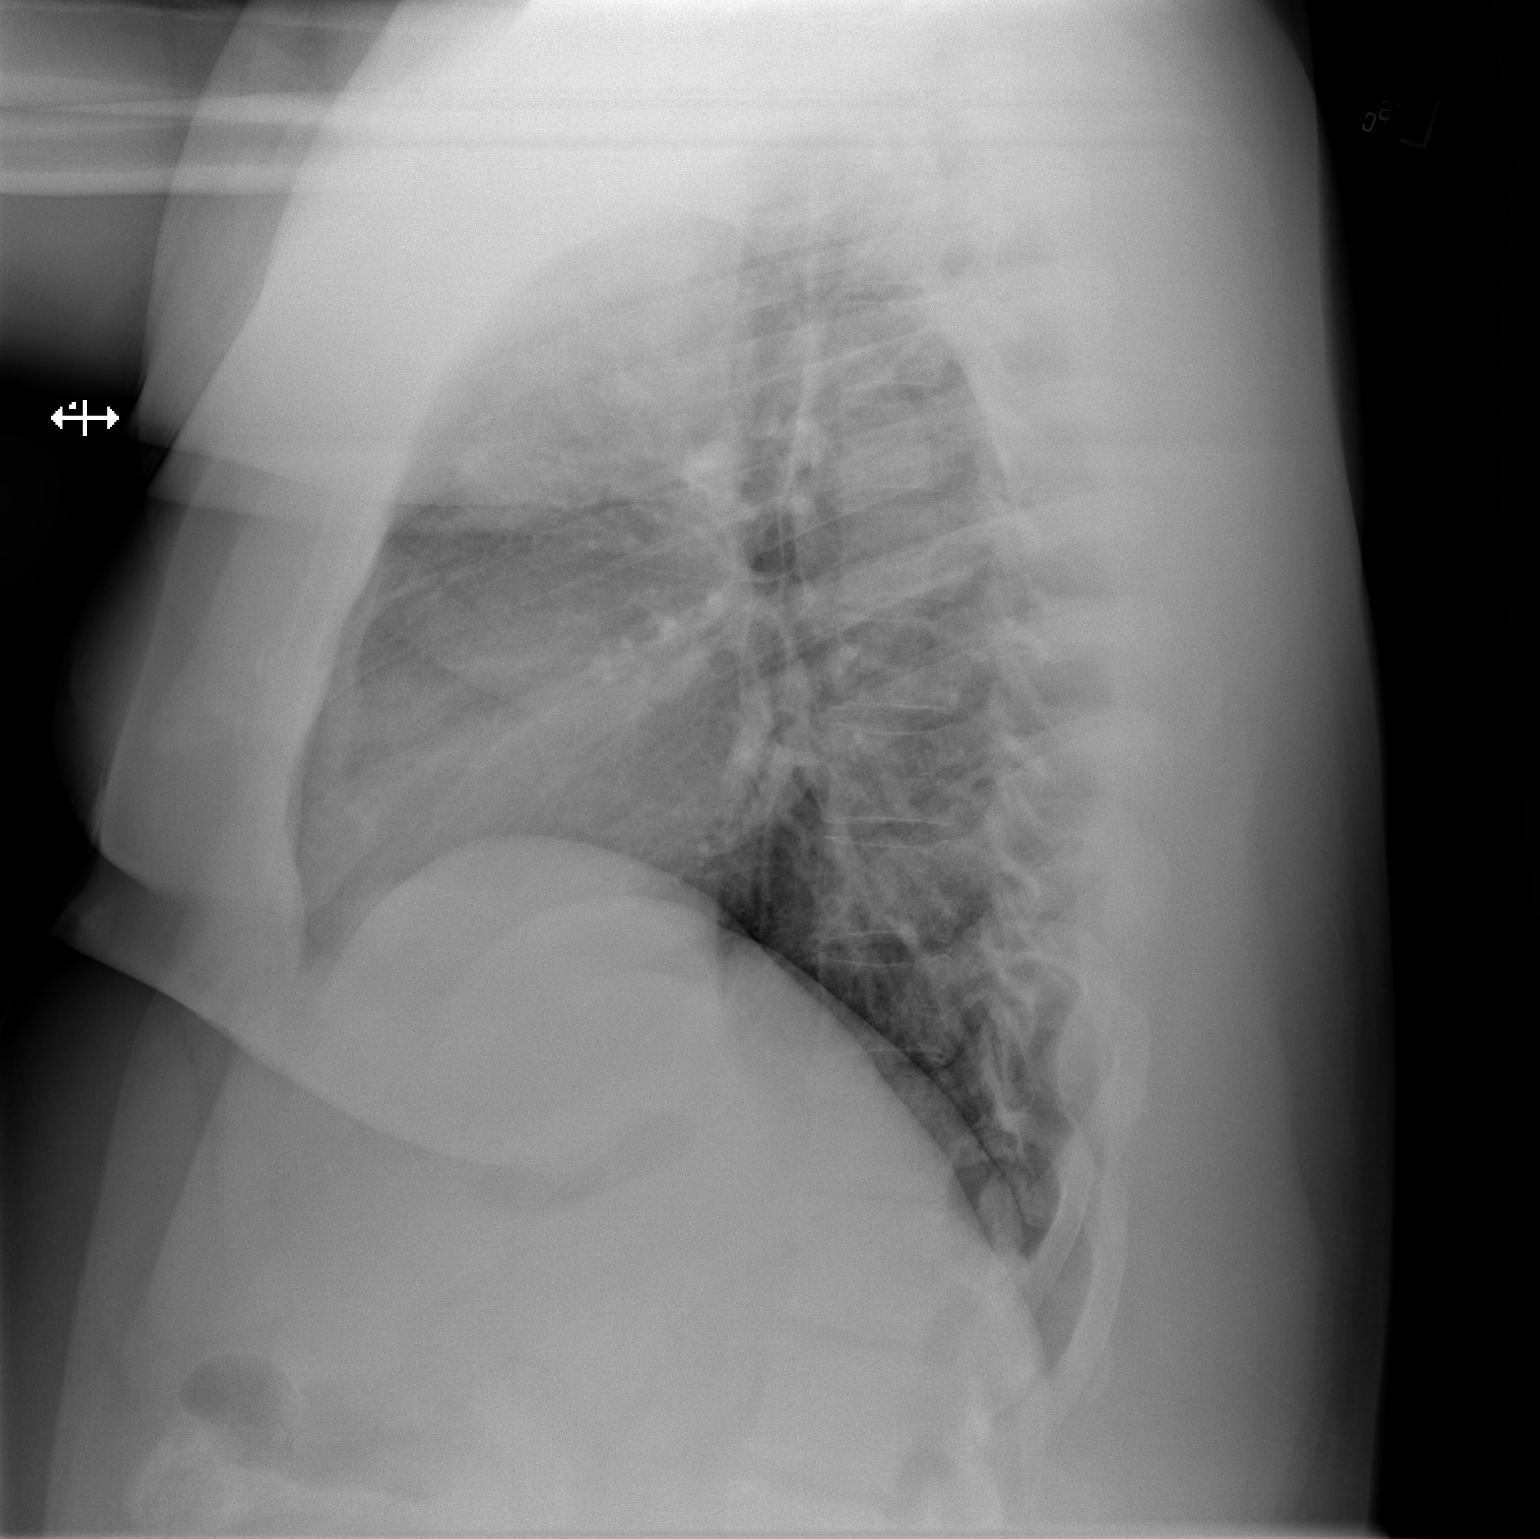

[2 of 2 positions shown; findings below may reference images not displayed]

FINDINGS: Lung volumes are normal. No consolidative airspace disease. No
pleural effusions. No pneumothorax. No pulmonary nodule or mass
noted. Pulmonary vasculature and the cardiomediastinal silhouette
are within normal limits.
IMPRESSION: No radiographic evidence of acute cardiopulmonary disease.

## 2023-01-22 ENCOUNTER — Other Ambulatory Visit: Payer: Self-pay | Admitting: Family Medicine

## 2023-01-22 ENCOUNTER — Ambulatory Visit: Payer: BC Managed Care – PPO | Admitting: Family Medicine

## 2023-01-22 DIAGNOSIS — E876 Hypokalemia: Secondary | ICD-10-CM

## 2023-01-29 ENCOUNTER — Ambulatory Visit: Payer: BC Managed Care – PPO | Admitting: Family Medicine

## 2023-01-29 ENCOUNTER — Encounter: Payer: Self-pay | Admitting: Family Medicine

## 2023-01-29 VITALS — BP 124/76 | HR 81 | Temp 97.0°F | Ht 72.75 in | Wt 284.8 lb

## 2023-01-29 DIAGNOSIS — I10 Essential (primary) hypertension: Secondary | ICD-10-CM

## 2023-01-29 NOTE — Progress Notes (Signed)
  [facility] [address] [place] [address] Dept: [phone] Dept Fax: [phone]  Chronic Care Office Visit  Subjective:    Patient ID: Eddie [name], male    DOB: [birth year], 37 y.o..   MRN: [id number]  Chief Complaint  Patient presents with   Hypertension    3 month f/u.  No concerns.    History of Present Illness:  Patient is in today for reassessment of chronic medical issues.  Eddie [name] was diagnosed with hypertension in the Fall of 2022. He is managed on amlodipine-valsartan (Exforge) 10-320 mg daily and chlorthalidone 25 mg daily.Hhe is tolerating this without complaint. His 7-day average at home is 129/814.   Past Medical History: Patient Active Problem List   Diagnosis Date Noted   Prediabetes 09/02/2022   Essential hypertension 03/07/2021   Cervical radiculopathy 03/07/2021   Obesity (BMI 35.0-39.9 without comorbidity) 03/07/2021   No past surgical history on file. Family History  Problem Relation Age of Onset   Diabetes Father        Leg amputation   Stroke Maternal Uncle    Heart disease Paternal Grandfather    Diabetes Paternal Grandfather    Hypertension Other        paternal side   Cancer Neg Hx    Outpatient Medications Prior to Visit  Medication Sig Dispense Refill   amLODipine-valsartan (EXFORGE) 10-320 MG tablet Take 1 tablet by mouth daily. 90 tablet 3   chlorthalidone (HYGROTON) 25 MG tablet Take 1 tablet (25 mg total) by mouth daily. 30 tablet 5   potassium chloride SA (KLOR-CON M20) 20 MEQ tablet TAKE 1 TABLET BY MOUTH EVERY DAY 90 tablet 3   No facility-administered medications prior to visit.   Allergies  Allergen Reactions   Influenza Vaccines Hives   Objective:   Today's Vitals   01/29/23 1350  BP: 124/76  Pulse: 81  Temp: (!) 97 F (36.1 C)  TempSrc: Temporal  SpO2: 94%  Weight: 284 lb 12.8 oz (129.2 kg)  Height: 6' 0.75" (1.848 m)   Body mass index is 37.83  kg/m.   General: Well developed, well nourished. No acute distress. Psych: Alert and oriented. Normal mood and affect.  There are no preventive care reminders to display for this patient.    Assessment & Plan:   Problem List Items Addressed This Visit       Cardiovascular and Mediastinum   Essential hypertension - Primary    Blood pressure is good control. Continue Exforge 10-320 mg daily and chlorthalidone 25 mg daily. He should continue to check his BP at home and I will reassess him in 4 months.       Return in about 4 months (around 05/31/2023) for Reassessment.   Loyola Mast, MD

## 2023-01-29 NOTE — Assessment & Plan Note (Signed)
Blood pressure is good control. Continue Exforge 10-320 mg daily and chlorthalidone 25 mg daily. He should continue to check his BP at home and I will reassess him in 4 months.

## 2023-02-27 ENCOUNTER — Other Ambulatory Visit: Payer: Self-pay | Admitting: Family Medicine

## 2023-02-27 DIAGNOSIS — I10 Essential (primary) hypertension: Secondary | ICD-10-CM

## 2023-04-19 ENCOUNTER — Other Ambulatory Visit: Payer: Self-pay | Admitting: Family Medicine

## 2023-04-19 DIAGNOSIS — I10 Essential (primary) hypertension: Secondary | ICD-10-CM

## 2023-06-04 ENCOUNTER — Ambulatory Visit: Payer: BC Managed Care – PPO | Admitting: Family Medicine

## 2023-06-07 ENCOUNTER — Ambulatory Visit: Payer: BC Managed Care – PPO | Admitting: Family Medicine

## 2023-06-07 ENCOUNTER — Ambulatory Visit: Payer: Self-pay | Admitting: Family Medicine

## 2023-06-07 NOTE — Telephone Encounter (Signed)
 Copied from CRM [id number]. Topic: Clinical - Red Word Triage >> Jun 07, 2023  2:58 PM Eddie [name] wrote: Red Word that prompted transfer to Nurse Triage: patient slipped yesterday and fell on his shoulder and now is in pain and can not lift his arm up   Chief Complaint: Shoulder Pain  Symptoms: Shoulder Pain, Weakness, Pain with ROM Frequency: Acute Pertinent Negatives: Patient denies numbness, tingling, or paralysis.  Disposition: [] ED /[] Urgent Care (no appt availability in office) / [x] Appointment(In office/virtual)/ []  [facility]/ [] Home Care/ [] Refused Recommended Disposition /[] [place] [facility]/ []  Follow-up with PCP Additional Notes: Eddie [name] is a 38 year old male being triaged today for a recent shoulder injury after a fall last night. The patient reports not being able to lift the arm without using the other arm to do so.  Cannot lift the right arm above the head without pain or limitation. Denies having taken any pain medication for relief of symptoms. The patient Is without any neurological deficit. Appointment made in office for today.    Reason for Disposition  Can'[name] move injured shoulder normally (e.g., full range of motion, able to touch top of head)  Answer Assessment - Initial Assessment Questions 1. ONSET: When did the pain start?     Yesterday  2. LOCATION: Where is the pain located?     Right Shoulder  3. PAIN: How bad is the pain? (Scale 1-10; or mild, moderate, severe)   - MILD (1-3): doesn'[name] interfere with normal activities   - MODERATE (4-7): interferes with normal activities (e.g., work or school) or awakens from sleep   - SEVERE (8-10): excruciating pain, unable to do any normal activities, unable to move arm at all due to pain     8  4. WORK OR EXERCISE: Has there been any recent work or exercise that involved this part of the body?     Recent Fall,   5. CAUSE: What do you think is causing the shoulder pain?     Recent Injury  6. OTHER  SYMPTOMS: Do you have any other symptoms? (e.g., neck pain, swelling, rash, fever, numbness, weakness)     No other symptoms  Answer Assessment - Initial Assessment Questions 1. MECHANISM: How did the injury happen?     Fall  2. ONSET: When did the injury happen? (Minutes or hours ago)      Last night  3. APPEARANCE of INJURY: What does the injury look like?      No changes in appearance  4. SEVERITY: Can you move the shoulder normally?      Cannot lift above head without limitation or assistance from other arm. Weakness in the affected arm.   6. PAIN: Is there pain? If Yes, ask: How bad is the pain?   (e.g., Scale 1-10; or mild, moderate, severe)   - MILD (1-3): doesn'[name] interfere with normal activities   - MODERATE (4-7): interferes with normal activities (e.g., work or school) or awakens from sleep   - SEVERE (8-10): excruciating pain, unable to do any normal activities, unable to move arm at all due to pain     8  7. TETANUS: For any breaks in the skin, ask: When was the last tetanus booster?     N/A  8. OTHER SYMPTOMS: Do you have any other symptoms? (e.g., loss of sensation)     No other symptoms  Protocols used: Shoulder Pain-A-AH, Shoulder Injury-A-AH

## 2023-06-07 NOTE — Telephone Encounter (Signed)
 Appointment changed to / with Dr Doreene Burke. Dm/cma

## 2023-06-08 ENCOUNTER — Encounter: Payer: Self-pay | Admitting: Family Medicine

## 2023-06-08 ENCOUNTER — Ambulatory Visit (INDEPENDENT_AMBULATORY_CARE_PROVIDER_SITE_OTHER)
Admission: RE | Admit: 2023-06-08 | Discharge: 2023-06-08 | Disposition: A | Discharge status: Home / Self Care | Payer: BC Managed Care – PPO | Source: Ambulatory Visit | Attending: Family Medicine | Admitting: Family Medicine

## 2023-06-08 ENCOUNTER — Ambulatory Visit: Payer: BC Managed Care – PPO | Admitting: Family Medicine

## 2023-06-08 ENCOUNTER — Telehealth: Payer: Self-pay | Admitting: Family Medicine

## 2023-06-08 VITALS — BP 156/100 | HR 95 | Temp 98.6°F | Ht 72.0 in | Wt 287.8 lb

## 2023-06-08 DIAGNOSIS — S46911A Strain of unspecified muscle, fascia and tendon at shoulder and upper arm level, right arm, initial encounter: Secondary | ICD-10-CM | POA: Diagnosis not present

## 2023-06-08 DIAGNOSIS — I10 Essential (primary) hypertension: Secondary | ICD-10-CM | POA: Diagnosis not present

## 2023-06-08 MED ORDER — MELOXICAM 7.5 MG PO TABS: 7.5000 mg | ORAL_TABLET | Freq: Every day | ORAL | 0 refills | Status: DC

## 2023-06-08 NOTE — Telephone Encounter (Signed)
 06/07/2023 same day cancel e2c2/no reason, 1st missed visit, pt seen 06/08/2023

## 2023-06-08 NOTE — Progress Notes (Signed)
 Established Patient Office Visit   Subjective:  Patient ID: Eddie [name], male    DOB: 12/27/1985  Age: 38 y.o. MRN: [id number]  Chief Complaint  Patient presents with   Shoulder Injury    Pt had a fall 1 day ago and landed on his right shoulder.     Shoulder Injury  Pertinent negatives include no tingling.   Encounter Diagnoses  Name Primary?   Strain of right shoulder, initial encounter Yes   Essential hypertension    Developed right shoulder pain after falling to the right yesterday and catching himself with an outstretched arm and hand.  No prior injury to the shoulder.  He does heavy lifting at work.  He did not take his blood pressure medications today.   Review of Systems  Constitutional: Negative.   HENT: Negative.    Eyes:  Negative for blurred vision, discharge and redness.  Respiratory: Negative.    Cardiovascular: Negative.   Gastrointestinal:  Negative for abdominal pain.  Genitourinary: Negative.   Musculoskeletal:  Positive for joint pain. Negative for myalgias.  Skin:  Negative for rash.  Neurological:  Negative for tingling, loss of consciousness and weakness.  Endo/Heme/Allergies:  Negative for polydipsia.     Current Outpatient Medications:    amLODipine -valsartan  (EXFORGE ) 10-320 MG tablet, Take 1 tablet by mouth daily., Disp: 90 tablet, Rfl: 3   chlorthalidone  (HYGROTON ) 25 MG tablet, TAKE 1 TABLET (25 MG TOTAL) BY MOUTH DAILY., Disp: 90 tablet, Rfl: 3   meloxicam  (MOBIC ) 7.5 MG tablet, Take 1 tablet (7.5 mg total) by mouth daily., Disp: 30 tablet, Rfl: 0   potassium chloride  SA (KLOR-CON  M20) 20 MEQ tablet, TAKE 1 TABLET BY MOUTH EVERY DAY, Disp: 90 tablet, Rfl: 3   Objective:     BP (!) 156/100 Comment: Did not take BP medication today  Pulse 95   Temp 98.6 F (37 C)   Ht 6' (1.829 m)   Wt 287 lb 12.8 oz (130.5 kg)   SpO2 95%   BMI 39.03 kg/m    Physical Exam Constitutional:      General: He is not in acute distress.    Appearance:  Normal appearance. He is not ill-appearing, toxic-appearing or diaphoretic.  HENT:     Head: Normocephalic and atraumatic.     Right Ear: External ear normal.     Left Ear: External ear normal.  Eyes:     General: No scleral icterus.       Right eye: No discharge.        Left eye: No discharge.     Extraocular Movements: Extraocular movements intact.     Conjunctiva/sclera: Conjunctivae normal.  Pulmonary:     Effort: Pulmonary effort is normal. No respiratory distress.  Musculoskeletal:     Left shoulder: No tenderness or bony tenderness. Decreased range of motion.  Skin:    General: Skin is warm and dry.  Neurological:     Mental Status: He is alert and oriented to person, place, and time.  Psychiatric:        Mood and Affect: Mood normal.        Behavior: Behavior normal.      No results found for any visits on 06/08/23.    The ASCVD Risk score (Arnett DK, et al., 2019) failed to calculate for the following reasons:   The 2019 ASCVD risk score is only valid for ages 51 to 17    Assessment & Plan:   Strain of right shoulder, initial encounter -  Meloxicam ; Take 1 tablet (7.5 mg total) by mouth daily.  Dispense: 30 tablet; Refill: 0 -     DG Shoulder Right; Future  Essential hypertension    Return Has follow-up with Dr. Thedora on the 24th..  Meloxicam  7.5 daily.  Out of work through Friday.  Strongly advised to take his blood pressure medications daily.   Elsie Sim Lent, MD

## 2023-06-14 ENCOUNTER — Encounter: Payer: Self-pay | Admitting: Family Medicine

## 2023-06-14 ENCOUNTER — Other Ambulatory Visit: Payer: Self-pay | Admitting: Family Medicine

## 2023-06-14 DIAGNOSIS — I10 Essential (primary) hypertension: Secondary | ICD-10-CM

## 2023-06-18 ENCOUNTER — Encounter: Payer: Self-pay | Admitting: Family Medicine

## 2023-06-18 ENCOUNTER — Ambulatory Visit: Payer: BC Managed Care – PPO | Admitting: Family Medicine

## 2023-06-18 VITALS — BP 130/82 | HR 82 | Temp 98.1°F | Ht 72.0 in | Wt 284.4 lb

## 2023-06-18 DIAGNOSIS — E876 Hypokalemia: Secondary | ICD-10-CM | POA: Diagnosis not present

## 2023-06-18 DIAGNOSIS — I10 Essential (primary) hypertension: Secondary | ICD-10-CM | POA: Diagnosis not present

## 2023-06-18 NOTE — Assessment & Plan Note (Signed)
Blood pressure is good control. Continue amlodipine-valsartan (Exforge) 10-320 mg daily and chlorthalidone 25 mg daily.

## 2023-06-18 NOTE — Progress Notes (Signed)
[facility] [address] [place] [address] Dept: [phone] Dept Fax: [phone]  Chronic Care Office Visit  Subjective:    Patient ID: Eddie [name], male    DOB: [birth year], 38 y.o..   MRN: [id number]  Chief Complaint  Patient presents with   Hypertension    4 month f/u HTN. 120/80,123/78   History of Present Illness:  Patient is in today for reassessment of chronic medical issues.  Mr. Moder was diagnosed with hypertension in the Fall of 2022. He is managed on amlodipine-valsartan (Exforge) 10-320 mg daily and chlorthalidone 25 mg daily. He is tolerating this without complaint. He has had hypokalemia in the past, so is also on a potassium supplement.  Past Medical History: Patient Active Problem List   Diagnosis Date Noted   Prediabetes 09/02/2022   Essential hypertension 03/07/2021   Cervical radiculopathy 03/07/2021   Obesity (BMI 35.0-39.9 without comorbidity) 03/07/2021   History reviewed. No pertinent surgical history. Family History  Problem Relation Age of Onset   Diabetes Father        Leg amputation   Stroke Maternal Uncle    Heart disease Paternal Grandfather    Diabetes Paternal Grandfather    Hypertension Other        paternal side   Cancer Neg Hx    Outpatient Medications Prior to Visit  Medication Sig Dispense Refill   amLODipine-valsartan (EXFORGE) 10-320 MG tablet TAKE 1 TABLET BY MOUTH EVERY DAY 90 tablet 3   chlorthalidone (HYGROTON) 25 MG tablet TAKE 1 TABLET (25 MG TOTAL) BY MOUTH DAILY. 90 tablet 3   meloxicam (MOBIC) 7.5 MG tablet Take 1 tablet (7.5 mg total) by mouth daily. 30 tablet 0   potassium chloride SA (KLOR-CON M20) 20 MEQ tablet TAKE 1 TABLET BY MOUTH EVERY DAY 90 tablet 3   No facility-administered medications prior to visit.   Allergies  Allergen Reactions   Influenza Vaccines Hives   Objective:   Today's Vitals   06/18/23 1317  BP: 130/82  Pulse: 82  Temp:  98.1 F (36.7 C)  TempSrc: Temporal  SpO2: 96%  Weight: 284 lb 6.4 oz (129 kg)  Height: 6' (1.829 m)   Body mass index is 38.57 kg/m.   General: Well developed, well nourished. No acute distress. Psych: Alert and oriented. Normal mood and affect.  There are no preventive care reminders to display for this patient.  Lab Results    Latest Ref Rng & Units 10/23/2022    2:04 PM 07/29/2022    2:11 PM 03/13/2021    1:52 PM  CMP  Glucose 70 - 99 mg/dL 528  91  413   BUN 6 - 20 mg/dL 17  10  11    Creatinine 0.76 - 1.27 mg/dL 2.44  0.10  2.72   Sodium 134 - 144 mmol/L 142  141  140   Potassium 3.5 - 5.2 mmol/L 3.2  3.8  3.5   Chloride 96 - 106 mmol/L 97  101  100   CO2 20 - 29 mmol/L 27  31  33   Calcium 8.7 - 10.2 mg/dL 53.6  9.9  9.8       Assessment & Plan:   Problem List Items Addressed This Visit       Cardiovascular and Mediastinum   Essential hypertension - Primary   Blood pressure is good control. Continue amlodipine-valsartan (Exforge) 10-320 mg daily and chlorthalidone 25 mg daily.      Other Visit Diagnoses  Hypokalemia       I will reassess the potassium level today for monitoring.   Relevant Orders   Basic metabolic panel       Return in about 4 months (around 10/16/2023) for Reassessment.   Loyola Mast, MD

## 2023-06-19 LAB — BASIC METABOLIC PANEL
BUN/Creatinine Ratio: 17 (ref 9–20)
BUN: 17 mg/dL (ref 6–20)
CO2: 23 mmol/L (ref 20–29)
Calcium: 9.8 mg/dL (ref 8.7–10.2)
Chloride: 98 mmol/L (ref 96–106)
Creatinine, Ser: 0.99 mg/dL (ref 0.76–1.27)
Glucose: 124 mg/dL — ABNORMAL HIGH (ref 70–99)
Potassium: 3.4 mmol/L — ABNORMAL LOW (ref 3.5–5.2)
Sodium: 141 mmol/L (ref 134–144)
eGFR: 101 mL/min/{1.73_m2} (ref >59)

## 2023-06-21 ENCOUNTER — Encounter: Payer: Self-pay | Admitting: Family Medicine

## 2023-10-15 ENCOUNTER — Ambulatory Visit: Payer: BC Managed Care – PPO | Admitting: Family Medicine

## 2023-10-22 ENCOUNTER — Ambulatory Visit: Admitting: Family Medicine

## 2023-10-22 ENCOUNTER — Encounter: Payer: Self-pay | Admitting: Family Medicine

## 2023-10-22 VITALS — BP 130/84 | HR 86 | Temp 97.6°F | Ht 72.0 in | Wt 288.6 lb

## 2023-10-22 DIAGNOSIS — I10 Essential (primary) hypertension: Secondary | ICD-10-CM

## 2023-10-22 NOTE — Progress Notes (Signed)
  [facility] [address] [place] [address] Dept: [phone] Dept Fax: [phone]  Chronic Care Office Visit  Subjective:    Patient ID: Eddie [name], male    DOB: 01/22/1986, 38 y.o..   MRN: [id number]  Chief Complaint  Patient presents with   Hypertension   History of Present Illness:  Patient is in today for reassessment of chronic medical issues.  Mr. Laswell was diagnosed with hypertension in the Fall of 2022. He is managed on amlodipine -valsartan  (Exforge ) 10-320 mg daily and chlorthalidone  25 mg daily. He is tolerating this without complaint. He has had hypokalemia in the past, so is also on a potassium supplement. He admits that over the past month, he has not been taking his chlorthalidone , as he had not picked up his refill. Review of his blood pressures shows his 7-day average is 135/85. In March (when on chlorthalidone ) his BP was averaging int he normal range.  Past Medical History: Patient Active Problem List   Diagnosis Date Noted   Prediabetes 09/02/2022   Essential hypertension 03/07/2021   Cervical radiculopathy 03/07/2021   Obesity (BMI 35.0-39.9 without comorbidity) 03/07/2021   No past surgical history on file. Family History  Problem Relation Age of Onset   Diabetes Father        Leg amputation   Stroke Maternal Uncle    Heart disease Paternal Grandfather    Diabetes Paternal Grandfather    Hypertension Other        paternal side   Cancer Neg Hx    Outpatient Medications Prior to Visit  Medication Sig Dispense Refill   amLODipine -valsartan  (EXFORGE ) 10-320 MG tablet TAKE 1 TABLET BY MOUTH EVERY DAY 90 tablet 3   chlorthalidone  (HYGROTON ) 25 MG tablet TAKE 1 TABLET (25 MG TOTAL) BY MOUTH DAILY. 90 tablet 3   meloxicam  (MOBIC ) 7.5 MG tablet Take 1 tablet (7.5 mg total) by mouth daily. 30 tablet 0   potassium chloride  SA (KLOR-CON  M20) 20 MEQ tablet TAKE 1 TABLET BY MOUTH EVERY DAY 90 tablet  3   No facility-administered medications prior to visit.   Allergies  Allergen Reactions   Influenza Vaccines Hives   Objective:   Today's Vitals   10/22/23 1311  BP: 130/84  Pulse: 86  Temp: 97.6 F (36.4 C)  TempSrc: Temporal  SpO2: 96%  Weight: 288 lb 9.6 oz (130.9 kg)  Height: 6' (1.829 m)   Body mass index is 39.14 kg/m.   General: Well developed, well nourished. No acute distress. Psych: Alert and oriented. Normal mood and affect.  There are no preventive care reminders to display for this patient.    Assessment & Plan:   Problem List Items Addressed This Visit       Cardiovascular and Mediastinum   Essential hypertension - Primary   Blood pressure is good control. Continue amlodipine -valsartan  (Exforge ) 10-320 mg daily and resume chlorthalidone  25 mg daily. Continue potassium supplementation.       Return in about 4 months (around 02/22/2024) for Reassessment.   Graig Lawyer, MD

## 2023-10-22 NOTE — Assessment & Plan Note (Signed)
 Blood pressure is good control. Continue amlodipine -valsartan  (Exforge ) 10-320 mg daily and resume chlorthalidone  25 mg daily. Continue potassium supplementation.

## 2023-12-30 ENCOUNTER — Other Ambulatory Visit: Payer: Self-pay

## 2023-12-30 DIAGNOSIS — I10 Essential (primary) hypertension: Secondary | ICD-10-CM

## 2023-12-30 MED ORDER — CHLORTHALIDONE 25 MG PO TABS: 25.0000 mg | ORAL_TABLET | Freq: Every day | ORAL | 2 refills | Status: AC

## 2024-02-25 ENCOUNTER — Ambulatory Visit: Admitting: Family Medicine

## 2024-03-03 ENCOUNTER — Ambulatory Visit: Admitting: Family Medicine

## 2024-03-19 ENCOUNTER — Other Ambulatory Visit: Payer: Self-pay | Admitting: Family Medicine

## 2024-03-19 DIAGNOSIS — E876 Hypokalemia: Secondary | ICD-10-CM

## 2024-03-31 ENCOUNTER — Ambulatory Visit: Admitting: Family Medicine

## 2024-05-05 ENCOUNTER — Ambulatory Visit: Admitting: Family Medicine

## 2024-05-05 ENCOUNTER — Encounter: Payer: Self-pay | Admitting: Family Medicine

## 2024-05-05 VITALS — HR 84 | Temp 98.4°F | Ht 72.0 in

## 2024-05-05 DIAGNOSIS — I10 Essential (primary) hypertension: Secondary | ICD-10-CM | POA: Diagnosis not present

## 2024-05-05 LAB — BASIC METABOLIC PANEL WITH GFR
BUN: 17 mg/dL (ref 6–23)
CO2: 33 meq/L — ABNORMAL HIGH (ref 19–32)
Calcium: 10 mg/dL (ref 8.4–10.5)
Chloride: 98 meq/L (ref 96–112)
Creatinine, Ser: 0.99 mg/dL (ref 0.40–1.50)
GFR: 96.47 mL/min (ref >60.00)
Glucose, Bld: 95 mg/dL (ref 70–99)
Potassium: 3.4 meq/L — ABNORMAL LOW (ref 3.5–5.1)
Sodium: 141 meq/L (ref 135–145)

## 2024-05-05 MED ORDER — AMLODIPINE BESYLATE-VALSARTAN 10-320 MG PO TABS: 1.0000 | ORAL_TABLET | Freq: Every day | ORAL | 3 refills | Status: AC

## 2024-05-05 NOTE — Assessment & Plan Note (Addendum)
 Blood pressure is adequate control. Continue amlodipine -valsartan  (Exforge ) 10-320 mg daily and chlorthalidone  25 mg daily. Continue potassium supplementation. I will recheck his potassium today.

## 2024-05-05 NOTE — Progress Notes (Signed)
 [facility] [address] [place] [address] Dept: [phone] Dept Fax: [phone]  Chronic Care Office Visit  Subjective:    Patient ID: Eddie [name], male    DOB: Oct 29, 1985, 38 y.o..   MRN: [id number]  Chief Complaint  Patient presents with   Hypertension    4 month f/u HTN.  No concerns.   Average BP at home 130-140's/80's.     History of Present Illness:  Patient is in today for reassessment of chronic medical conditions.   Eddie [name] was diagnosed with hypertension in the Fall of 2022. He is managed on amlodipine -valsartan  (Exforge ) 10-320 mg daily and chlorthalidone  25 mg daily. He is tolerating this without complaint. He has had hypokalemia in the past, so is also on a potassium supplement. He notes he doesn't take this everyday, as the pill is large.  Past Medical History: Patient Active Problem List   Diagnosis Date Noted   Prediabetes 09/02/2022   Essential hypertension 03/07/2021   Cervical radiculopathy 03/07/2021   Obesity (BMI 35.0-39.9 without comorbidity) 03/07/2021   No past surgical history on file. Family History  Problem Relation Age of Onset   Diabetes Father        Leg amputation   Stroke Maternal Uncle    Heart disease Paternal Grandfather    Diabetes Paternal Grandfather    Hypertension Other        paternal side   Cancer Neg Hx    Outpatient Medications Prior to Visit  Medication Sig Dispense Refill   amLODipine -valsartan  (EXFORGE ) 10-320 MG tablet TAKE 1 TABLET BY MOUTH EVERY DAY 90 tablet 3   chlorthalidone  (HYGROTON ) 25 MG tablet Take 1 tablet (25 mg total) by mouth daily. 90 tablet 2   meloxicam  (MOBIC ) 7.5 MG tablet Take 1 tablet (7.5 mg total) by mouth daily. 30 tablet 0   potassium chloride  SA (KLOR-CON  M) 20 MEQ tablet TAKE 1 TABLET BY MOUTH EVERY DAY 90 tablet 3   No facility-administered medications prior to visit.   Allergies[1]   Objective:   Today's Vitals    05/05/24 1315  Pulse: 84  Temp: 98.4 F (36.9 C)  TempSrc: Temporal  SpO2: 98%  Height: 6' (1.829 m)   Body mass index is 39.14 kg/m.   General: Well developed, well nourished. No acute distress. Psych: Alert and oriented. Normal mood and affect.  Health Maintenance Due  Topic Date Due   Hepatitis B Vaccines 19-59 Average Risk (1 of 3 - 19+ 3-dose series) Never done   HPV VACCINES (1 - 3-dose SCDM series) Never done   Influenza Vaccine  12/24/2023   COVID-19 Vaccine (1 - 2025-26 season) Never done   Lab Results    Latest Ref Rng & Units 06/18/2023    1:40 PM 10/23/2022    2:04 PM 07/29/2022    2:11 PM  CMP  Glucose 70 - 99 mg/dL 875  897  91   BUN 6 - 20 mg/dL 17  17  10    Creatinine 0.76 - 1.27 mg/dL 9.00  9.06  9.11   Sodium 134 - 144 mmol/L 141  142  141   Potassium 3.5 - 5.2 mmol/L 3.4  3.2  3.8   Chloride 96 - 106 mmol/L 98  97  101   CO2 20 - 29 mmol/L 23  27  31    Calcium 8.7 - 10.2 mg/dL 9.8  89.8  9.9       Assessment & Plan:   Problem List  Items Addressed This Visit       Cardiovascular and Mediastinum   Essential hypertension - Primary   Blood pressure is adequate control. Continue amlodipine -valsartan  (Exforge ) 10-320 mg daily and chlorthalidone  25 mg daily. Continue potassium supplementation. I will recheck his potassium today.      Relevant Medications   amLODipine -valsartan  (EXFORGE ) 10-320 MG tablet   Other Relevant Orders   Basic metabolic panel with GFR    Return in about 3 months (around 08/03/2024) for Reassessment.   Garnette CHRISTELLA Simpler, MD  I,Emily Lagle,acting as a scribe for Garnette CHRISTELLA Simpler, MD.,have documented all relevant documentation on the behalf of Garnette CHRISTELLA Simpler, MD.  I, Garnette CHRISTELLA Simpler, MD, have reviewed all documentation for this visit. The documentation on 05/05/2024 for the exam, diagnosis, procedures, and orders are all accurate and complete.     [1]  Allergies Allergen Reactions   Influenza Vaccines Hives

## 2024-05-08 ENCOUNTER — Ambulatory Visit: Payer: Self-pay | Admitting: Family Medicine

## 2024-08-18 ENCOUNTER — Ambulatory Visit: Admitting: Family Medicine
